# Patient Record
Sex: Male | Born: 1970 | Race: White | Hispanic: No | Marital: Married | Smoking: Never smoker
Health system: Southern US, Community
[De-identification: ages and names within clinical notes are randomized; demographics above are authoritative.]

## PROBLEM LIST (undated history)

## (undated) DIAGNOSIS — J019 Acute sinusitis, unspecified: Secondary | ICD-10-CM

## (undated) DIAGNOSIS — T7840XA Allergy, unspecified, initial encounter: Secondary | ICD-10-CM

## (undated) HISTORY — DX: Acute sinusitis, unspecified: J01.90

## (undated) HISTORY — DX: Allergy, unspecified, initial encounter: T78.40XA

---

## 1995-06-24 HISTORY — PX: KNEE ARTHROSCOPY W/ MENISCAL REPAIR: SHX1877

## 2006-10-09 ENCOUNTER — Encounter: Payer: Self-pay | Admitting: Family Medicine

## 2006-12-02 ENCOUNTER — Encounter (INDEPENDENT_AMBULATORY_CARE_PROVIDER_SITE_OTHER): Payer: Self-pay | Admitting: *Deleted

## 2007-01-19 ENCOUNTER — Ambulatory Visit (HOSPITAL_COMMUNITY): Admission: RE | Admit: 2007-01-19 | Discharge: 2007-01-19 | Payer: Self-pay | Admitting: Family Medicine

## 2007-01-19 ENCOUNTER — Ambulatory Visit: Payer: Self-pay | Admitting: Family Medicine

## 2007-02-01 ENCOUNTER — Encounter: Payer: Self-pay | Admitting: Family Medicine

## 2007-11-29 ENCOUNTER — Encounter: Payer: Self-pay | Admitting: Family Medicine

## 2008-11-30 ENCOUNTER — Ambulatory Visit: Payer: Self-pay | Admitting: Family Medicine

## 2008-12-01 LAB — CONVERTED CEMR LAB
ALT: 21 units/L (ref 0–53)
AST: 27 units/L (ref 0–37)
BUN: 16 mg/dL (ref 6–23)
Bilirubin, Direct: 0 mg/dL (ref 0.0–0.3)
Calcium: 9.4 mg/dL (ref 8.4–10.5)
Cholesterol: 176 mg/dL (ref 0–200)
Creatinine, Ser: 0.9 mg/dL (ref 0.4–1.5)
Eosinophils Relative: 2.1 % (ref 0.0–5.0)
GFR calc non Af Amer: 99.34 mL/min (ref >60)
LDL Cholesterol: 108 mg/dL — ABNORMAL HIGH (ref 0–99)
Monocytes Absolute: 0.6 10*3/uL (ref 0.1–1.0)
Monocytes Relative: 12.1 % — ABNORMAL HIGH (ref 3.0–12.0)
Neutrophils Relative %: 55.8 % (ref 43.0–77.0)
Platelets: 183 10*3/uL (ref 150.0–400.0)
Total Bilirubin: 0.9 mg/dL (ref 0.3–1.2)
Triglycerides: 67 mg/dL (ref 0.0–149.0)
VLDL: 13.4 mg/dL (ref 0.0–40.0)
WBC: 5 10*3/uL (ref 4.5–10.5)

## 2009-01-18 ENCOUNTER — Telehealth: Payer: Self-pay | Admitting: *Deleted

## 2009-04-20 ENCOUNTER — Ambulatory Visit: Payer: Self-pay | Admitting: Family Medicine

## 2009-04-20 DIAGNOSIS — J019 Acute sinusitis, unspecified: Secondary | ICD-10-CM | POA: Insufficient documentation

## 2009-04-20 HISTORY — DX: Acute sinusitis, unspecified: J01.90

## 2010-01-15 ENCOUNTER — Ambulatory Visit: Payer: Self-pay | Admitting: Family Medicine

## 2010-01-15 LAB — CONVERTED CEMR LAB
ALT: 24 U/L (ref 0–53)
AST: 22 U/L (ref 0–37)
Albumin: 4.1 g/dL (ref 3.5–5.2)
Alkaline Phosphatase: 45 U/L (ref 39–117)
BUN: 15 mg/dL (ref 6–23)
Basophils Absolute: 0 K/uL (ref 0.0–0.1)
Basophils Relative: 0.4 % (ref 0.0–3.0)
Bilirubin Urine: NEGATIVE
Bilirubin, Direct: 0.1 mg/dL (ref 0.0–0.3)
Blood in Urine, dipstick: NEGATIVE
CO2: 31 meq/L (ref 19–32)
Calcium: 9.1 mg/dL (ref 8.4–10.5)
Chloride: 104 meq/L (ref 96–112)
Cholesterol: 224 mg/dL — ABNORMAL HIGH (ref 0–200)
Creatinine, Ser: 0.9 mg/dL (ref 0.4–1.5)
Direct LDL: 146.8 mg/dL
Eosinophils Absolute: 0.2 K/uL (ref 0.0–0.7)
Eosinophils Relative: 3.9 % (ref 0.0–5.0)
GFR calc non Af Amer: 95.11 mL/min (ref >60)
Glucose, Bld: 98 mg/dL (ref 70–99)
Glucose, Urine, Semiquant: NEGATIVE
HCT: 44 % (ref 39.0–52.0)
HDL: 58 mg/dL (ref >39.00)
Hemoglobin: 15.3 g/dL (ref 13.0–17.0)
Ketones, urine, test strip: NEGATIVE
Lymphocytes Relative: 25.5 % (ref 12.0–46.0)
Lymphs Abs: 1.5 K/uL (ref 0.7–4.0)
MCHC: 34.7 g/dL (ref 30.0–36.0)
MCV: 93.4 fL (ref 78.0–100.0)
Monocytes Absolute: 0.7 K/uL (ref 0.1–1.0)
Monocytes Relative: 11.6 % (ref 3.0–12.0)
Neutro Abs: 3.4 K/uL (ref 1.4–7.7)
Neutrophils Relative %: 58.6 % (ref 43.0–77.0)
Nitrite: NEGATIVE
Platelets: 180 K/uL (ref 150.0–400.0)
Potassium: 3.8 meq/L (ref 3.5–5.1)
Protein, U semiquant: NEGATIVE
RBC: 4.72 M/uL (ref 4.22–5.81)
RDW: 13.4 % (ref 11.5–14.6)
Sodium: 139 meq/L (ref 135–145)
Specific Gravity, Urine: 1.02
TSH: 1.21 u[IU]/mL (ref 0.35–5.50)
Total Bilirubin: 0.8 mg/dL (ref 0.3–1.2)
Total CHOL/HDL Ratio: 4
Total Protein: 6.6 g/dL (ref 6.0–8.3)
Triglycerides: 124 mg/dL (ref 0.0–149.0)
Urobilinogen, UA: 0.2
VLDL: 24.8 mg/dL (ref 0.0–40.0)
WBC Urine, dipstick: NEGATIVE
WBC: 5.8 10*3/microliter (ref 4.5–10.5)
pH: 7

## 2010-01-25 ENCOUNTER — Ambulatory Visit: Payer: Self-pay | Admitting: Family Medicine

## 2010-07-12 ENCOUNTER — Ambulatory Visit
Admission: RE | Admit: 2010-07-12 | Discharge: 2010-07-12 | Payer: Self-pay | Source: Home / Self Care | Attending: Family Medicine | Admitting: Family Medicine

## 2010-07-25 NOTE — Assessment & Plan Note (Signed)
Summary: SINUSITIS, ST // RS   Vital Signs:  Patient profile:   40 year old male Temp:     97.8 degrees F oral BP sitting:   128 / 78  (left arm) Cuff size:   regular  Vitals Entered By: Sid Falcon LPN (July 12, 2010 2:53 PM)  History of Present Illness: Onset illness 3-4 days ago. sinus congestion, sore throat. Other family members sick.  Some body aches. Nausea without vomiting.  Some mild cough.  Freq sneezing. Took Nyquil with some relief.  No fever.  Some mild yellowish discharge.  Allergies: 1)  ! Percocet (Oxycodone-Acetaminophen)  Past History:  Past Medical History: Last updated: 04/20/2009 Chicken pox Hay fever/Allergies  Physical Exam  General:  Well-developed,well-nourished,in no acute distress; alert,appropriate and cooperative throughout examination Ears:  External ear exam shows no significant lesions or deformities.  Otoscopic examination reveals clear canals, tympanic membranes are intact bilaterally without bulging, retraction, inflammation or discharge. Hearing is grossly normal bilaterally. Nose:  External nasal examination shows no deformity or inflammation. Nasal mucosa are pink and moist without lesions or exudates. Mouth:  Oral mucosa and oropharynx without lesions or exudates.  Teeth in good repair. Neck:  No deformities, masses, or tenderness noted. Lungs:  Normal respiratory effort, chest expands symmetrically. Lungs are clear to auscultation, no crackles or wheezes. Heart:  normal rate and regular rhythm.     Impression & Recommendations:  Problem # 1:  ACUTE SINUSITIS, UNSPECIFIED (ICD-461.9) suspect viral at this time but wrote Ab script if symptoms worsen or persist as hx freq sinusitis in past. His updated medication list for this problem includes:    Amoxicillin 875 Mg Tabs (Amoxicillin) ..... One by mouth two times a day for 10 days  Complete Medication List: 1)  One-a-day Mens Tabs (Multiple vitamin) .... Once daily 2)  Vit C   .... Once daily 3)  Amoxicillin 875 Mg Tabs (Amoxicillin) .... One by mouth two times a day for 10 days  Patient Instructions: 1)  Acute sinusitis symptoms for less than 10 days are not helped by antibiotics. Use warm moist compresses, and over the counter decongestants( only as directed). Call if no improvement in 5-7 days, sooner if increasing pain, fever, or new symptoms.  Prescriptions: AMOXICILLIN 875 MG TABS (AMOXICILLIN) one by mouth two times a day for 10 days  #20 x 0   Entered and Authorized by:   Evelena Peat MD   Signed by:   Evelena Peat MD on 07/12/2010   Method used:   Print then Give to Patient   RxID:   [id number]    Orders Added: 1)  Est. Patient Level III [76160]

## 2010-07-25 NOTE — Assessment & Plan Note (Signed)
Summary: cpx/njr   Vital Signs:  Patient profile:   40 year old male Height:      69.25 inches Weight:      157 pounds BMI:     23.10 Temp:     97.9 degrees F oral Pulse rate:   44 / minute Pulse rhythm:   regular Resp:     12 per minute BP sitting:   120 / 82  (left arm) Cuff size:   regular  Vitals Entered By: Sid Falcon LPN (January 25, 2010 3:05 PM) CC: CPX   CC:  CPX.  History of Present Illness: Here for CPE.  No chronic medical problems.  Exercises regularly. Hx of bradycardia related to exercise. No medications.  FH and SH reviewed and no signif changes.  Clinical Review Panels:  Immunizations   Last Tetanus Booster:  Historical (09/22/2006)  Lipid Management   Cholesterol:  224 (01/15/2010)   LDL (bad choesterol):  108 (11/30/2008)   HDL (good cholesterol):  58.00 (01/15/2010)  Diabetes Management   Creatinine:  0.9 (01/15/2010)  CBC   WBC:  5.8 (01/15/2010)   RBC:  4.72 (01/15/2010)   Hgb:  15.3 (01/15/2010)   Hct:  44.0 (01/15/2010)   Platelets:  180.0 (01/15/2010)   MCV  93.4 (01/15/2010)   MCHC  34.7 (01/15/2010)   RDW  13.4 (01/15/2010)   PMN:  58.6 (01/15/2010)   Lymphs:  25.5 (01/15/2010)   Monos:  11.6 (01/15/2010)   Eosinophils:  3.9 (01/15/2010)   Basophil:  0.4 (01/15/2010)  Complete Metabolic Panel   Glucose:  98 (01/15/2010)   Sodium:  139 (01/15/2010)   Potassium:  3.8 (01/15/2010)   Chloride:  104 (01/15/2010)   CO2:  31 (01/15/2010)   BUN:  15 (01/15/2010)   Creatinine:  0.9 (01/15/2010)   Albumin:  4.1 (01/15/2010)   Total Protein:  6.6 (01/15/2010)   Calcium:  9.1 (01/15/2010)   Total Bili:  0.8 (01/15/2010)   Alk Phos:  45 (01/15/2010)   SGPT (ALT):  24 (01/15/2010)   SGOT (AST):  22 (01/15/2010)   Allergies: 1)  ! Percocet (Oxycodone-Acetaminophen)  Past History:  Past Medical History: Last updated: 04/20/2009 Chicken pox Hay fever/Allergies  Past Surgical History: Last updated: 11/30/2008 Knee  ligament meniscus tear 1997  Family History: Last updated: 11/30/2008 Family history prostate cancer Family history heart disease, parent (father late 54s), grandparent Family History Hypertension, parent, grandparent  Social History: Last updated: 11/30/2008 Occupation:  Runner, broadcasting/film/video Married Never Smoked Alcohol use-no  Risk Factors: Smoking Status: never (11/30/2008) PMH-FH-SH reviewed for relevance  Review of Systems  The patient denies anorexia, fever, weight loss, weight gain, vision loss, decreased hearing, hoarseness, chest pain, syncope, dyspnea on exertion, peripheral edema, prolonged cough, headaches, hemoptysis, abdominal pain, melena, hematochezia, severe indigestion/heartburn, hematuria, incontinence, genital sores, muscle weakness, suspicious skin lesions, transient blindness, difficulty walking, depression, unusual weight change, abnormal bleeding, enlarged lymph nodes, and testicular masses.    Physical Exam  General:  Well-developed,well-nourished,in no acute distress; alert,appropriate and cooperative throughout examination Head:  Normocephalic and atraumatic without obvious abnormalities. No apparent alopecia or balding. Eyes:  No corneal or conjunctival inflammation noted. EOMI. Perrla. Funduscopic exam benign, without hemorrhages, exudates or papilledema. Vision grossly normal. Ears:  External ear exam shows no significant lesions or deformities.  Otoscopic examination reveals clear canals, tympanic membranes are intact bilaterally without bulging, retraction, inflammation or discharge. Hearing is grossly normal bilaterally. Mouth:  Oral mucosa and oropharynx without lesions or exudates.  Teeth in good repair.  Neck:  No deformities, masses, or tenderness noted. Lungs:  Normal respiratory effort, chest expands symmetrically. Lungs are clear to auscultation, no crackles or wheezes. Heart:  patient has bradycardia which is normal for him as he does long-distance running  but normal rhythm no gallop.   Abdomen:  Bowel sounds positive,abdomen soft and non-tender without masses, organomegaly or hernias noted. Genitalia:  Testes bilaterally descended without nodularity, tenderness or masses. No scrotal masses or lesions. No penis lesions or urethral discharge. Msk:  No deformity or scoliosis noted of thoracic or lumbar spine.   Extremities:  No clubbing, cyanosis, edema, or deformity noted with normal full range of motion of all joints.   Neurologic:  No cranial nerve deficits noted. Station and gait are normal. Plantar reflexes are down-going bilaterally. DTRs are symmetrical throughout. Sensory, motor and coordinative functions appear intact. Skin:  Intact without suspicious lesions or rashes Cervical Nodes:  No lymphadenopathy noted Psych:  Cognition and judgment appear intact. Alert and cooperative with normal attention span and concentration. No apparent delusions, illusions, hallucinations   Impression & Recommendations:  Problem # 1:  Preventive Health Care (ICD-V70.0) labs reviewed with patient. Minimally elevated cholesterol otherwise normal. Discussed diet relevant to lipids.  He is not interested in more aggressive therapy for lipids at this time in terms of statins.  Complete Medication List: 1)  One-a-day Mens Tabs (Multiple vitamin) .... Once daily 2)  Vit C  .... Once daily

## 2011-02-05 ENCOUNTER — Other Ambulatory Visit (INDEPENDENT_AMBULATORY_CARE_PROVIDER_SITE_OTHER): Payer: BC Managed Care – PPO

## 2011-02-05 DIAGNOSIS — Z0000 Encounter for general adult medical examination without abnormal findings: Secondary | ICD-10-CM

## 2011-02-05 LAB — TSH: TSH: 1.59 u[IU]/mL (ref 0.35–5.50)

## 2011-02-05 LAB — POCT URINALYSIS DIPSTICK
Bilirubin, UA: NEGATIVE
Blood, UA: NEGATIVE
Glucose, UA: NEGATIVE
Ketones, UA: NEGATIVE
Nitrite, UA: NEGATIVE
Spec Grav, UA: 1.015
pH, UA: 7

## 2011-02-05 LAB — CBC WITH DIFFERENTIAL/PLATELET
Basophils Relative: 0.2 % (ref 0.0–3.0)
Eosinophils Absolute: 0.1 10*3/uL (ref 0.0–0.7)
Eosinophils Relative: 2.4 % (ref 0.0–5.0)
HCT: 46.4 % (ref 39.0–52.0)
Lymphs Abs: 1.3 10*3/uL (ref 0.7–4.0)
MCHC: 34.1 g/dL (ref 30.0–36.0)
MCV: 93.3 fl (ref 78.0–100.0)
Monocytes Absolute: 0.6 10*3/uL (ref 0.1–1.0)
Neutrophils Relative %: 63.8 % (ref 43.0–77.0)
Platelets: 200 10*3/uL (ref 150.0–400.0)
RBC: 4.97 Mil/uL (ref 4.22–5.81)

## 2011-02-05 LAB — HEPATIC FUNCTION PANEL
ALT: 24 U/L (ref 0–53)
AST: 27 U/L (ref 0–37)
Alkaline Phosphatase: 50 U/L (ref 39–117)
Total Bilirubin: 0.5 mg/dL (ref 0.3–1.2)

## 2011-02-05 LAB — BASIC METABOLIC PANEL
BUN: 17 mg/dL (ref 6–23)
CO2: 28 mEq/L (ref 19–32)
Chloride: 107 mEq/L (ref 96–112)
Creatinine, Ser: 1.1 mg/dL (ref 0.4–1.5)
Potassium: 3.8 mEq/L (ref 3.5–5.1)

## 2011-02-05 LAB — LIPID PANEL
Total CHOL/HDL Ratio: 4
Triglycerides: 78 mg/dL (ref 0.0–149.0)

## 2011-02-12 ENCOUNTER — Encounter: Payer: Self-pay | Admitting: Family Medicine

## 2011-02-13 ENCOUNTER — Encounter: Payer: Self-pay | Admitting: Family Medicine

## 2011-02-13 ENCOUNTER — Ambulatory Visit (INDEPENDENT_AMBULATORY_CARE_PROVIDER_SITE_OTHER): Payer: BC Managed Care – PPO | Admitting: Family Medicine

## 2011-02-13 VITALS — BP 112/80 | HR 64 | Temp 98.0°F | Resp 16 | Ht 69.0 in | Wt 158.0 lb

## 2011-02-13 DIAGNOSIS — Z0000 Encounter for general adult medical examination without abnormal findings: Secondary | ICD-10-CM

## 2011-02-13 NOTE — Progress Notes (Signed)
  Subjective:    Patient ID: DEMPSY Barrera, male    DOB: 1970/09/11, 40 y.o.   MRN: [id number]  HPI Patient here for complete physical. No chronic medical problems. Takes no prescription medications. Previous intolerance to oxycodone. Takes multivitamins. Tetanus 2008. Past medical history, social history, and family history reviewed.  Father had CAD in his 66s  Patient continues to exercise regularly. Runs about 20-30 miles per week.  Past Medical History  Diagnosis Date  . Allergy   . Acute sinusitis, unspecified 04/20/2009   Past Surgical History  Procedure Date  . Knee arthroscopy w/ meniscal repair 1997    reports that he has never smoked. He has never used smokeless tobacco. He reports that he does not drink alcohol. His drug history not on file. family history includes Cancer in an unspecified family member; Heart disease (age of onset:46) in his father; and Hypertension in an unspecified family member. Allergies  Allergen Reactions  . Oxycodone-Acetaminophen     REACTION: rash      Review of Systems  Constitutional: Negative for fever, activity change, appetite change, fatigue and unexpected weight change.  HENT: Negative for ear pain, congestion and trouble swallowing.   Eyes: Negative for pain and visual disturbance.  Respiratory: Negative for cough, shortness of breath and wheezing.   Cardiovascular: Negative for chest pain and palpitations.  Gastrointestinal: Negative for nausea, vomiting, abdominal pain, diarrhea, constipation, blood in stool, abdominal distention and rectal pain.  Genitourinary: Negative for dysuria, hematuria and testicular pain.  Musculoskeletal: Negative for joint swelling and arthralgias.  Skin: Negative for rash.  Neurological: Negative for dizziness, syncope and headaches.  Hematological: Negative for adenopathy.  Psychiatric/Behavioral: Negative for confusion and dysphoric mood.       Objective:   Physical Exam  Constitutional: He is  oriented to person, place, and time. He appears well-developed and well-nourished. No distress.  HENT:  Head: Normocephalic and atraumatic.  Right Ear: External ear normal.  Left Ear: External ear normal.  Mouth/Throat: Oropharynx is clear and moist.  Eyes: Conjunctivae and EOM are normal. Pupils are equal, round, and reactive to light.  Neck: Normal range of motion. Neck supple. No thyromegaly present.  Cardiovascular: Normal rate, regular rhythm and normal heart sounds.   No murmur heard. Pulmonary/Chest: No respiratory distress. He has no wheezes. He has no rales.  Abdominal: Soft. Bowel sounds are normal. He exhibits no distension and no mass. There is no tenderness. There is no rebound and no guarding.  Musculoskeletal: He exhibits no edema.  Lymphadenopathy:    He has no cervical adenopathy.  Neurological: He is alert and oriented to person, place, and time. He displays normal reflexes. No cranial nerve deficit.  Skin: No rash noted.       No concerning skin lesions  Psychiatric: He has a normal mood and affect. His behavior is normal.          Assessment & Plan:  Healthy 40 year old male. Labs reviewed with patient. Minimally elevated lipids otherwise normal. Tetanus up to date. Continue regular exercise. Recommendation for flu vaccine this fall.

## 2011-05-27 ENCOUNTER — Ambulatory Visit: Payer: BC Managed Care – PPO | Admitting: Internal Medicine

## 2011-06-03 ENCOUNTER — Ambulatory Visit (INDEPENDENT_AMBULATORY_CARE_PROVIDER_SITE_OTHER): Payer: BC Managed Care – PPO | Admitting: Family Medicine

## 2011-06-03 ENCOUNTER — Encounter: Payer: Self-pay | Admitting: Family Medicine

## 2011-06-03 VITALS — BP 120/80 | Temp 98.4°F | Wt 154.0 lb

## 2011-06-03 DIAGNOSIS — J45909 Unspecified asthma, uncomplicated: Secondary | ICD-10-CM

## 2011-06-03 MED ORDER — PREDNISONE 20 MG PO TABS: ORAL_TABLET | ORAL | Status: DC

## 2011-06-03 MED ORDER — HYDROCODONE-HOMATROPINE 5-1.5 MG/5ML PO SYRP: ORAL_SOLUTION | ORAL | Status: DC

## 2011-06-03 NOTE — Progress Notes (Signed)
  Subjective:    Patient ID: Warren Barrera, male    DOB: 1970/07/10, 40 y.o.   MRN: [id number]  HPI Warren Barrera s a 40 year old, married male, nonsmoker .....Marland Kitchen[place].........Marland Kitchen Went to [employer]........... English major......... 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  He developed a cough two months ago, and it hasn't gone away.  He said the fever, earache, sore throat, nausea, vomiting, diarrhea.  He does not recall any history of asthma.  He does have a history of allergic rhinitis.  Environmental review of systems at work and home negative.   Review of Systems    General and immunologic review of systems in pulmonary of systems otherwise negative Objective:   Physical Exam Well-developed well-nourished man no acute distress.  HEENT negative.  Neck was supple.  No adenopathy.  Lungs were clear       Assessment & Plan:  Reactive airway disease.  Plan prednisone burst and taper return p.r.n.

## 2011-06-03 NOTE — Patient Instructions (Signed)
Take the prednisone as directed........Marland Kitchen Two tabs x 3 days............. Or untily get a whole lot better........ Then taper as directed.  Hydromet one half to 1 teaspoon at bedtime p.r.n. Cough.  Return p.r.n.

## 2011-06-04 ENCOUNTER — Ambulatory Visit: Payer: BC Managed Care – PPO | Admitting: Family Medicine

## 2011-06-10 ENCOUNTER — Telehealth: Payer: Self-pay

## 2011-06-10 NOTE — Telephone Encounter (Signed)
[facility] Triage Call Report Triage Record Num: [id number] Operator: Durward Mallard [facility] Patient Name: Warren Barrera Call Date & Time: 06/07/2011 8:57:30AM Patient Phone: [phone] PCP: Evelena Peat Patient Gender: Male PCP Fax : [phone] Patient DOB: Oct 24, 1970 [facility] Name: [facility] Reason for Call: Caller: Tom/Patient; PCP: Caryl Never (Burr-shet), Elberta Fortis.; CB#: [phone]; Call regarding Cough/Congestion; sx started 2 months ago for the cough; was seen in the office on 06/03/11 and was dx with RAD and Prednisone was started; Prednisone is not improving the sx; pt states that he has severe yellow discharge with a h/a; he feels that he has a sinus infection; pt is unsure if he has a fever; can not take temp at this time; triaged per URI Guideline; See in 24 hr d/t "Moderate h/a for more than 24 hr unrelieved with nonprescription medications;" will go to UC and not sure which he will go to at this time; [facility] recommended Protocol(s) Used: Upper Respiratory Infection (URI) Recommended Outcome per Protocol: See Provider within 24 hours Reason for Outcome: Mild to moderate headache for more than 24 hours unrelieved with nonprescription medications Care Advice: ~ Use a cool mist humidifier to moisten air. Be sure to clean according to manufacturer's instructions. Call provider if headache worsens, develops temperature greater than 101.20F (38.1C) or any temperature elevation in a geriatric or immunocompromised patient (such as diabetes, HIV/AIDS, chemotherapy, organ transplant, or chronic steroid use). ~ ~ SYMPTOM / CONDITION MANAGEMENT A warm, moist compress placed on face, over eyes for 15 to 20 minutes, 5 to 6 times a day, may help relieve the congestion. ~ Analgesic/Antipyretic Advice - Acetaminophen: Consider acetaminophen as directed on label or by pharmacist/provider for pain or fever. PRECAUTIONS: - Use only if there is no history of liver disease,  alcoholism, or intake of three or more alcohol drinks per day. - If approved by provider when breastfeeding. - Do not exceed recommended dose or frequency. ~ 12/

## 2011-06-11 ENCOUNTER — Encounter: Payer: Self-pay | Admitting: Family Medicine

## 2011-06-11 ENCOUNTER — Ambulatory Visit (INDEPENDENT_AMBULATORY_CARE_PROVIDER_SITE_OTHER): Payer: BC Managed Care – PPO | Admitting: Family Medicine

## 2011-06-11 VITALS — BP 120/84 | Temp 98.3°F | Wt 157.0 lb

## 2011-06-11 DIAGNOSIS — J019 Acute sinusitis, unspecified: Secondary | ICD-10-CM

## 2011-06-11 DIAGNOSIS — R05 Cough: Secondary | ICD-10-CM

## 2011-06-11 MED ORDER — AMOXICILLIN-POT CLAVULANATE 875-125 MG PO TABS: 1.0000 | ORAL_TABLET | Freq: Two times a day (BID) | ORAL | Status: AC

## 2011-06-11 NOTE — Patient Instructions (Signed)
Call in 2 weeks if cough not better and sooner as needed.

## 2011-06-11 NOTE — Progress Notes (Signed)
  Subjective:    Patient ID: Warren Barrera, male    DOB: [birth year], 40 y.o.   MRN: [id number]  HPI  Cough off and on for almost 2 months. Continues to run. No history of smoking. No history of reactive airway disease. Recently placed on prednisone with no relief. Is not aware of any wheezing. No active GERD symptoms. No history of asthma. No postnasal drip symptoms. He does have intermittent facial pain greenish nasal discharge for several weeks. Intermittent headaches. No fever. Intermittent sore throat. He is concerned about sinusitis issues. No recent travels. No dyspnea.   Review of Systems  Constitutional: Negative for fever, chills and fatigue.  Respiratory: Positive for cough. Negative for shortness of breath, wheezing and stridor.   Cardiovascular: Negative for chest pain.       Objective:   Physical Exam  Constitutional: He appears well-developed and well-nourished. No distress.  HENT:  Right Ear: External ear normal.  Left Ear: External ear normal.  Mouth/Throat: Oropharynx is clear and moist.  Neck: Neck supple.  Cardiovascular: Normal rate and regular rhythm.   Pulmonary/Chest: Effort normal and breath sounds normal. No respiratory distress. He has no wheezes. He has no rales.  Lymphadenopathy:    He has no cervical adenopathy.          Assessment & Plan:  Chronic cough. Suspect acute versus chronic sinusitis. Augmentin 875 mg twice daily for 10 days. Chest x-ray if symptoms not resolving in the next 2 weeks

## 2011-06-12 ENCOUNTER — Ambulatory Visit: Payer: BC Managed Care – PPO | Admitting: Family Medicine

## 2011-06-26 ENCOUNTER — Ambulatory Visit (INDEPENDENT_AMBULATORY_CARE_PROVIDER_SITE_OTHER): Payer: BC Managed Care – PPO | Admitting: Family Medicine

## 2011-06-26 ENCOUNTER — Ambulatory Visit: Payer: BC Managed Care – PPO | Admitting: Family Medicine

## 2011-06-26 ENCOUNTER — Encounter: Payer: Self-pay | Admitting: Family Medicine

## 2011-06-26 VITALS — BP 110/78 | Temp 98.0°F | Wt 155.0 lb

## 2011-06-26 DIAGNOSIS — R059 Cough, unspecified: Secondary | ICD-10-CM

## 2011-06-26 DIAGNOSIS — R05 Cough: Secondary | ICD-10-CM

## 2011-06-26 MED ORDER — OMEPRAZOLE 20 MG PO CPDR: 20.0000 mg | DELAYED_RELEASE_CAPSULE | Freq: Every day | ORAL | Status: DC

## 2011-06-26 NOTE — Progress Notes (Signed)
  Subjective:    Patient ID: Warren Barrera, male    DOB: 1971/04/22, 41 y.o.   MRN: [id number]  HPI  Persistent cough off and on since October. Patient was seen last visit with probable acute sinusitis symptoms. Treat with Augmentin and sinus symptoms have improved. His cough remains mostly nonproductive and is a least 50% improved though not resolved. He has no history of asthma or reactive airway disease. No history of smoking. Continues to run for exercise. No dyspnea. Denies post nasal drip symptoms. Not aware of any wheezing. Does clear throat frequently.  No active reflux but does have occasional hoarseness. Patient denies any appetite changes, weight changes, dyspnea, or chest pain.  Never filled cough suppressant. Was previously on one prednisone burst but this did not help his cough.   Review of Systems  Constitutional: Negative for fever, chills, appetite change and unexpected weight change.  HENT: Negative for sore throat, voice change, postnasal drip and sinus pressure.   Respiratory: Positive for cough. Negative for shortness of breath and wheezing.   Cardiovascular: Negative for chest pain, palpitations and leg swelling.  Neurological: Negative for syncope and headaches.  Hematological: Negative for adenopathy.       Objective:   Physical Exam  Constitutional: He appears well-developed and well-nourished.  HENT:  Mouth/Throat: Oropharynx is clear and moist.  Neck: Neck supple.  Cardiovascular: Normal rate and regular rhythm.   Pulmonary/Chest: Effort normal and breath sounds normal. No respiratory distress. He has no wheezes. He has no rales.  Lymphadenopathy:    He has no cervical adenopathy.          Assessment & Plan:  Chronic cough. Normal exam. Question GERD related. He does not have evidence for reactive airway disease. No post nasal drip symptoms at this point. Recent sinusitis symptoms have improved. Scale back caffeine use. No peppermint or spearmint  products. Educational sheet given on GERD prevention. Elevate head of bed 6-10 inches. Omeprazole 20 mg daily. Obtain chest x-ray. Consider pulmonary referral 2-3 weeks if no improvement

## 2011-06-26 NOTE — Patient Instructions (Signed)
Diet for GERD or PUD Nutrition therapy can help ease the discomfort of gastroesophageal reflux disease (GERD) and peptic ulcer disease (PUD).  HOME CARE INSTRUCTIONS   Eat your meals slowly, in a relaxed setting.   Eat 5 to 6 small meals per day.   If a food causes distress, stop eating it for a period of time.  FOODS TO AVOID  Coffee, regular or decaffeinated.   Cola beverages, regular or low calorie.   Tea, regular or decaffeinated.   Pepper.   Cocoa.   High fat foods, including meats.   Butter, margarine, hydrogenated oil (trans fats).   Peppermint or spearmint (if you have GERD).   Fruits and vegetables if not tolerated.   Alcohol.   Nicotine (smoking or chewing). This is one of the most potent stimulants to acid production in the gastrointestinal tract.   Any food that seems to aggravate your condition.  If you have questions regarding your diet, ask your caregiver or a registered dietitian. TIPS  Lying flat may make symptoms worse. Keep the head of your bed raised 6 to 9 inches (15 to 23 cm) by using a foam wedge or blocks under the legs of the bed.   Do not lay down until 3 hours after eating a meal.   Daily physical activity may help reduce symptoms.  MAKE SURE YOU:   Understand these instructions.   Will watch your condition.   Will get help right away if you are not doing well or get worse.  Document Released: 06/09/2005 Document Revised: 02/19/2011 Document Reviewed: 10/23/2008 [facility] Patient Information 2012 [place], [place].  Consider elevation of head of bed 6-10 inches Gradually wean caffeine use. Call in 2 weeks if cough not resolving.

## 2011-06-27 ENCOUNTER — Ambulatory Visit (INDEPENDENT_AMBULATORY_CARE_PROVIDER_SITE_OTHER)
Admission: RE | Admit: 2011-06-27 | Discharge: 2011-06-27 | Disposition: A | Discharge status: Home / Self Care | Payer: BC Managed Care – PPO | Source: Ambulatory Visit | Attending: Family Medicine | Admitting: Family Medicine

## 2011-06-27 ENCOUNTER — Ambulatory Visit: Payer: BC Managed Care – PPO | Admitting: Family Medicine

## 2011-06-27 DIAGNOSIS — R05 Cough: Secondary | ICD-10-CM

## 2011-07-01 NOTE — Progress Notes (Signed)
Quick Note:  Pt informed on personally identified VM ______ 

## 2011-10-31 ENCOUNTER — Other Ambulatory Visit: Payer: Self-pay | Admitting: Family Medicine

## 2011-12-11 ENCOUNTER — Telehealth: Payer: Self-pay | Admitting: Family Medicine

## 2011-12-11 NOTE — Telephone Encounter (Signed)
Pt would like to have a particle test done with his cpx labs. Please advise

## 2011-12-11 NOTE — Telephone Encounter (Signed)
Pt is going to talk to his father and find out what test he had, he will call us back.

## 2011-12-11 NOTE — Telephone Encounter (Signed)
LMTCB

## 2011-12-11 NOTE — Telephone Encounter (Signed)
Make sure he is referring to NMR or Lipoprotein profile.  If so, may order.  May have to check with Gwen to order.

## 2011-12-11 NOTE — Telephone Encounter (Signed)
Pt called back with additional information about the lab he is requesting.  Pt has family history of CAD, father has been treated just recently.  Pt is asking for the more "in depth particle test for his LDL, cholesterol".  Pt labs are scheduled for mid July.

## 2011-12-26 ENCOUNTER — Other Ambulatory Visit: Payer: Self-pay | Admitting: *Deleted

## 2011-12-26 DIAGNOSIS — Z8249 Family history of ischemic heart disease and other diseases of the circulatory system: Secondary | ICD-10-CM

## 2012-01-06 ENCOUNTER — Other Ambulatory Visit (INDEPENDENT_AMBULATORY_CARE_PROVIDER_SITE_OTHER): Payer: BC Managed Care – PPO

## 2012-01-06 DIAGNOSIS — Z8249 Family history of ischemic heart disease and other diseases of the circulatory system: Secondary | ICD-10-CM

## 2012-01-06 DIAGNOSIS — Z0000 Encounter for general adult medical examination without abnormal findings: Secondary | ICD-10-CM

## 2012-01-06 LAB — BASIC METABOLIC PANEL WITH GFR
BUN: 16 mg/dL (ref 6–23)
CO2: 28 meq/L (ref 19–32)
Calcium: 9.5 mg/dL (ref 8.4–10.5)
Chloride: 104 meq/L (ref 96–112)
Creatinine, Ser: 1.1 mg/dL (ref 0.4–1.5)
GFR: 81.9 mL/min
Glucose, Bld: 102 mg/dL — ABNORMAL HIGH (ref 70–99)
Potassium: 4.2 meq/L (ref 3.5–5.1)
Sodium: 141 meq/L (ref 135–145)

## 2012-01-06 LAB — CBC WITH DIFFERENTIAL/PLATELET
Basophils Absolute: 0 K/uL (ref 0.0–0.1)
Basophils Relative: 0.4 % (ref 0.0–3.0)
Eosinophils Absolute: 0.1 K/uL (ref 0.0–0.7)
Eosinophils Relative: 1.7 % (ref 0.0–5.0)
HCT: 45.7 % (ref 39.0–52.0)
Hemoglobin: 15.3 g/dL (ref 13.0–17.0)
Lymphocytes Relative: 15.5 % (ref 12.0–46.0)
Lymphs Abs: 1.2 K/uL (ref 0.7–4.0)
MCHC: 33.4 g/dL (ref 30.0–36.0)
MCV: 93.2 fl (ref 78.0–100.0)
Monocytes Absolute: 0.8 K/uL (ref 0.1–1.0)
Monocytes Relative: 10.2 % (ref 3.0–12.0)
Neutro Abs: 5.5 K/uL (ref 1.4–7.7)
Neutrophils Relative %: 72.2 % (ref 43.0–77.0)
Platelets: 193 K/uL (ref 150.0–400.0)
RBC: 4.9 Mil/uL (ref 4.22–5.81)
RDW: 13.1 % (ref 11.5–14.6)
WBC: 7.6 K/uL (ref 4.5–10.5)

## 2012-01-06 LAB — POCT URINALYSIS DIPSTICK
Bilirubin, UA: NEGATIVE
Blood, UA: NEGATIVE
Glucose, UA: NEGATIVE
Ketones, UA: NEGATIVE
Leukocytes, UA: NEGATIVE
Nitrite, UA: NEGATIVE
Protein, UA: NEGATIVE
Spec Grav, UA: 1.01
Urobilinogen, UA: 0.2
pH, UA: 7

## 2012-01-06 LAB — HEPATIC FUNCTION PANEL
ALT: 22 U/L (ref 0–53)
Albumin: 4.3 g/dL (ref 3.5–5.2)
Bilirubin, Direct: 0 mg/dL (ref 0.0–0.3)
Total Protein: 7.1 g/dL (ref 6.0–8.3)

## 2012-01-06 LAB — LIPID PANEL
Total CHOL/HDL Ratio: 4
Triglycerides: 81 mg/dL (ref 0.0–149.0)

## 2012-01-07 LAB — NMR, LIPOPROFILE

## 2012-01-12 ENCOUNTER — Encounter: Payer: Self-pay | Admitting: Family Medicine

## 2012-01-12 ENCOUNTER — Ambulatory Visit (INDEPENDENT_AMBULATORY_CARE_PROVIDER_SITE_OTHER): Payer: BC Managed Care – PPO | Admitting: Family Medicine

## 2012-01-12 VITALS — BP 120/72 | HR 40 | Temp 98.7°F | Resp 12 | Ht 69.5 in | Wt 160.0 lb

## 2012-01-12 DIAGNOSIS — E7800 Pure hypercholesterolemia, unspecified: Secondary | ICD-10-CM | POA: Insufficient documentation

## 2012-01-12 DIAGNOSIS — Z0000 Encounter for general adult medical examination without abnormal findings: Secondary | ICD-10-CM

## 2012-01-12 NOTE — Patient Instructions (Addendum)
Consider tapering off omprazole

## 2012-01-12 NOTE — Progress Notes (Signed)
Subjective:    Patient ID: Warren Barrera, male    DOB: 09-Apr-1971, 41 y.o.   MRN: [id number]  HPI  Patient seen for complete physical examination. He has generally been very healthy. Chronic cough several months ago which eventually resolved after taking omeprazole. Possible silent reflux. No active reflux symptoms. He has consider tapering off omeprazole this time.  Very health-conscious. Runs about 25 miles per week. 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 No dizziness. No syncope. Family history significant father premature CAD age 25. Also had maternal uncle with CAD age 22. Patient nonsmoker. No history of diabetes. No history of hypertension. Patient had requested an NMR profile to further stratify risk and this was obtained on recent lab work  Past Medical History  Diagnosis Date  . Allergy   . Acute sinusitis, unspecified 04/20/2009   Past Surgical History  Procedure Date  . Knee arthroscopy w/ meniscal repair 1997    reports that he has never smoked. He has never used smokeless tobacco. He reports that he does not drink alcohol. His drug history not on file. family history includes Cancer in an unspecified family member; Heart disease (age of onset:46) in his father; and Hypertension in an unspecified family member. Allergies  Allergen Reactions  . Oxycodone-Acetaminophen     REACTION: rash from Percocet     Review of Systems  Constitutional: Negative for fever, activity change, appetite change and fatigue.  HENT: Negative for ear pain, congestion and trouble swallowing.   Eyes: Negative for pain and visual disturbance.  Respiratory: Negative for cough, shortness of breath and wheezing.   Cardiovascular: Negative for chest pain and palpitations.  Gastrointestinal: Negative for nausea, vomiting, abdominal pain, diarrhea, constipation, blood in stool, abdominal distention and rectal pain.  Genitourinary: Negative for dysuria, hematuria and testicular  pain.  Musculoskeletal: Negative for joint swelling and arthralgias.  Skin: Negative for rash.  Neurological: Negative for dizziness, syncope and headaches.  Hematological: Negative for adenopathy.  Psychiatric/Behavioral: Negative for confusion and dysphoric mood.       Objective:   Physical Exam  Constitutional: He is oriented to person, place, and time. He appears well-developed and well-nourished. No distress.  HENT:  Head: Normocephalic and atraumatic.  Right Ear: External ear normal.  Left Ear: External ear normal.  Mouth/Throat: Oropharynx is clear and moist.  Eyes: Conjunctivae and EOM are normal. Pupils are equal, round, and reactive to light.  Neck: Normal range of motion. Neck supple. No thyromegaly present.  Cardiovascular: Regular rhythm and normal heart sounds.   No murmur heard.      Bradycardic with rate around 44 (this is his baseline and has been so for years)  Pulmonary/Chest: No respiratory distress. He has no wheezes. He has no rales.  Abdominal: Soft. Bowel sounds are normal. He exhibits no distension and no mass. There is no tenderness. There is no rebound and no guarding.  Musculoskeletal: He exhibits no edema.  Lymphadenopathy:    He has no cervical adenopathy.  Neurological: He is alert and oriented to person, place, and time. He displays normal reflexes. No cranial nerve deficit.  Skin: No rash noted.  Psychiatric: He has a normal mood and affect.          Assessment & Plan:  Complete physical. Labs reviewed with patient including recent NMR profile which is fairly favorable. His pattern is more compatible with insulin sensitivity than insulin resistance. Has fairly large LDL particle size. After long discussion of pros and cons of  statin therapy patient wishes to wait at this time. His recent LDL was 139 by NMR profile. He is bradycardic on exam but asymptomatic.  Continue with regular exercise. Try tapering off omeprazole  We also discussed possible  cardiac CT scanning at some point to further stratify risk at this point he wishes to wait

## 2012-09-20 ENCOUNTER — Ambulatory Visit (INDEPENDENT_AMBULATORY_CARE_PROVIDER_SITE_OTHER): Payer: 59 | Admitting: Family Medicine

## 2012-09-20 ENCOUNTER — Encounter: Payer: Self-pay | Admitting: Family Medicine

## 2012-09-20 VITALS — BP 110/70 | Temp 101.7°F

## 2012-09-20 DIAGNOSIS — R509 Fever, unspecified: Secondary | ICD-10-CM

## 2012-09-20 DIAGNOSIS — R05 Cough: Secondary | ICD-10-CM

## 2012-09-20 MED ORDER — AZITHROMYCIN 250 MG PO TABS: ORAL_TABLET | ORAL | Status: AC

## 2012-09-20 NOTE — Progress Notes (Signed)
  Subjective:    Patient ID: Warren Barrera, male    DOB: November 28, 1970, 42 y.o.   MRN: [id number]  HPI Patient seen with 2 week history of cough Mostly productive cough. Was doing reasonably well and even ran several miles yesterday but last night developed chills and worsening headache. He has some sore throat and minimal nasal congestion. Fever today up to 101. Denies any dyspnea. No nausea or vomiting. No skin rash. 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  Past Medical History  Diagnosis Date  . Allergy   . Acute sinusitis, unspecified 04/20/2009   Past Surgical History  Procedure Laterality Date  . Knee arthroscopy w/ meniscal repair  1997    reports that he has never smoked. He has never used smokeless tobacco. He reports that he does not drink alcohol. His drug history is not on file. family history includes Cancer in an unspecified family member; Heart disease (age of onset: 73) in his father; and Hypertension in an unspecified family member. Allergies  Allergen Reactions  . Oxycodone-Acetaminophen     REACTION: rash from Percocet      Review of Systems  Constitutional: Positive for fever and chills.  HENT: Positive for sore throat.   Respiratory: Positive for cough. Negative for shortness of breath.   Gastrointestinal: Negative for nausea and vomiting.  Neurological: Positive for headaches.       Objective:   Physical Exam  HENT:  Right Ear: External ear normal.  Left Ear: External ear normal.  Mouth/Throat: Oropharynx is clear and moist.  Neck: Neck supple.  Cardiovascular: Normal rate and regular rhythm.   Pulmonary/Chest: Effort normal. He has no wheezes.  Patient has some questionable faint rhonchi/rales left base. These cleared somewhat with deep breathing. No wheezing. No retractions  Lymphadenopathy:    He has no cervical adenopathy.  Skin: No rash noted.          Assessment & Plan:  Febrile illness with cough. This may be viral but concern is  fever developing 2 weeks into course of illness. He is nontoxic and has been saturating 97% and no red flags to indicate need for admission. Check CBC with differential. Start Zithromax. Consider chest x-ray if symptoms worsen.

## 2012-09-20 NOTE — Patient Instructions (Addendum)
Follow up promptly for any vomiting, shortness of breath, or any worsening symptoms.

## 2012-09-21 LAB — CBC WITH DIFFERENTIAL/PLATELET
Eosinophils Relative: 0.5 % (ref 0.0–5.0)
HCT: 40.5 % (ref 39.0–52.0)
Monocytes Relative: 10.7 % (ref 3.0–12.0)
Neutrophils Relative %: 81.4 % — ABNORMAL HIGH (ref 43.0–77.0)
Platelets: 244 10*3/uL (ref 150.0–400.0)
WBC: 12.7 10*3/uL — ABNORMAL HIGH (ref 4.5–10.5)

## 2012-09-22 NOTE — Progress Notes (Signed)
Quick Note:  Pt informed on work and cell personally identified VM ______

## 2013-01-18 ENCOUNTER — Other Ambulatory Visit (INDEPENDENT_AMBULATORY_CARE_PROVIDER_SITE_OTHER): Payer: 59

## 2013-01-18 DIAGNOSIS — Z0000 Encounter for general adult medical examination without abnormal findings: Secondary | ICD-10-CM

## 2013-01-18 LAB — BASIC METABOLIC PANEL
CO2: 29 mEq/L (ref 19–32)
Chloride: 102 mEq/L (ref 96–112)
Glucose, Bld: 90 mg/dL (ref 70–99)
Potassium: 3.5 mEq/L (ref 3.5–5.1)
Sodium: 139 mEq/L (ref 135–145)

## 2013-01-18 LAB — CBC WITH DIFFERENTIAL/PLATELET
Basophils Absolute: 0 10*3/uL (ref 0.0–0.1)
Eosinophils Absolute: 0.2 10*3/uL (ref 0.0–0.7)
Hemoglobin: 15.9 g/dL (ref 13.0–17.0)
Lymphocytes Relative: 27.2 % (ref 12.0–46.0)
MCHC: 33.7 g/dL (ref 30.0–36.0)
Neutro Abs: 3 10*3/uL (ref 1.4–7.7)
Platelets: 213 10*3/uL (ref 150.0–400.0)
RDW: 13.2 % (ref 11.5–14.6)

## 2013-01-18 LAB — LIPID PANEL
Cholesterol: 201 mg/dL — ABNORMAL HIGH (ref 0–200)
HDL: 46.7 mg/dL (ref >39.00)
Triglycerides: 93 mg/dL (ref 0.0–149.0)

## 2013-01-18 LAB — HEPATIC FUNCTION PANEL
Albumin: 4.2 g/dL (ref 3.5–5.2)
Alkaline Phosphatase: 51 U/L (ref 39–117)
Total Bilirubin: 0.6 mg/dL (ref 0.3–1.2)

## 2013-01-18 LAB — POCT URINALYSIS DIPSTICK
Bilirubin, UA: NEGATIVE
Glucose, UA: NEGATIVE
Nitrite, UA: NEGATIVE
Spec Grav, UA: 1.015
Urobilinogen, UA: 0.2

## 2013-01-18 LAB — TSH: TSH: 1.41 u[IU]/mL (ref 0.35–5.50)

## 2013-02-03 ENCOUNTER — Encounter: Payer: 59 | Admitting: Family Medicine

## 2013-02-24 ENCOUNTER — Encounter (HOSPITAL_BASED_OUTPATIENT_CLINIC_OR_DEPARTMENT_OTHER): Admission: RE | Payer: Self-pay | Source: Ambulatory Visit

## 2013-02-24 ENCOUNTER — Inpatient Hospital Stay (HOSPITAL_BASED_OUTPATIENT_CLINIC_OR_DEPARTMENT_OTHER): Admission: RE | Admit: 2013-02-24 | Payer: 59 | Source: Ambulatory Visit | Admitting: Cardiovascular Disease

## 2013-02-24 SURGERY — JV LEFT AND RIGHT HEART CATHETERIZATION WITH CORONARY ANGIOGRAM

## 2013-03-17 ENCOUNTER — Encounter: Payer: Self-pay | Admitting: Family Medicine

## 2013-03-17 ENCOUNTER — Ambulatory Visit (INDEPENDENT_AMBULATORY_CARE_PROVIDER_SITE_OTHER): Payer: 59 | Admitting: Family Medicine

## 2013-03-17 VITALS — BP 122/80 | HR 77 | Temp 97.8°F | Ht 69.0 in | Wt 157.0 lb

## 2013-03-17 DIAGNOSIS — Z0000 Encounter for general adult medical examination without abnormal findings: Secondary | ICD-10-CM

## 2013-03-17 NOTE — Patient Instructions (Addendum)
Calcium Screening Score of the Heart Calcium screening score is an imaging test used to look for calcium deposits on the inner lining of the coronary arteries (blood vessels of the heart). This procedure, also known as a heart scan, is a "non-invasive test" which means no surgery or needles are used. It involves taking pictures using computerized tomography (CT). The amount of radiation exposure is very low. Deposits of calcium and plaques (deposits of fatty materials) can partly clog and narrow the coronary arteries. These deposits can happen without producing symptoms or warning signs. They increase the risk of a heart attack. The calcium screening score is a test that can detect these deposits before symptoms develop.  THIS TEST IS NOT RECOMMENDED IF YOU HAVE A PACEMAKER. PREPARATION FOR TEST   This test does not need any special preparation.  You may continue taking all your usual medicines.  You will need to stop smoking, avoid nicotine gum or patches, and avoid caffeine-containing beverages (such as Coke Classic, Diet Coke, Pepsi, Diet Pepsi, [facility], Dr. Reino Kent and other caffeinated drinks); all coffee (roasted, instant, decaffeinated roasted, decaffeinated instant); hot chocolate (cocoa); tea; and all chocolate (including milk, dark, and Baker[employer]) 4 hours before the test. All of these can make the heart beat faster and can affect accurate test results.  Inform your caregiver if you are pregnant or undergoing any radiation therapy.  It may be helpful to wear comfortable clothes. Avoid wearing clothing that contains metal. Women should avoid wearing an under-wire bra.  You may be advised to remove your dentures, hearing aids, eyeglasses, jewelry, and hairpins before the procedure. PROCEDURE  You will need to lie on a special scanning table.  Electrodes (small metal disks) may be placed on your chest to help record the electrical activity of the heart.  The examination table will be  moved through the machine as the actual CT scanning is performed.  You will be asked to hold your breath for a period of 20-30 seconds while the images are recorded.  You may be asked to wait till the technologist is satisfied with the quality of images. NORMAL FINDINGS Your score may vary from 0 to 400 and above. A score of zero indicates that the test shows no problems. A positive test means that you may be at an increased risk of coronary artery disease (CAD). A score of 400 or above means that the test detects widespread problems. Your risk of having or getting CAD is based on your score:  1-10 indicates low risk of CAD.  11-100 indicates moderate risk of CAD.  101-400 denotes moderately high risk of CAD.  401 and above indicates high risk of CAD. MEANING OF TEST If you have heart disease, the test results can help your caregiver better manage your problems. If narrowing or clogging of your coronary arteries is found with this test, your caregiver can help you decide if additional testing is needed.  Your caregiver may suggest this test even if you have no symptoms of heart disease. He/she may recommend this test if you have one or more of the following risk factors:  Family history of heart disease.  High blood pressure.  High cholesterol.  Diabetes mellitus.  Smoking.  Male aged above 17 or male above 13.  Overweight.  A stressful life.  Physically inactive. OBTAINING THE TEST RESULTS  Not all test results are available during your visit. If your test results are not back during the visit, make an appointment with your  caregiver to find out the results. Do not assume everything is normal if you have not heard from your caregiver or the medical facility. It is important for you to follow up on all of your test results. Document Released: 12/06/2007 Document Revised: 09/01/2011 Document Reviewed: 12/06/2007 [facility] Patient Information 2014 [place], [place].

## 2013-03-17 NOTE — Progress Notes (Signed)
  Subjective:    Patient ID: Warren Barrera, male    DOB: 11-22-1970, 42 y.o.   MRN: [id number]  HPI Patient here for complete physical Generally feels well. He continues to run about 5 days per week. Got flu vaccine recently through his employer.  Never smoked. Tetanus up-to-date. Positive family history of premature CAD in his father. Patient is otherwise very low risk.  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 Wife is Education officer, community.  Past Medical History  Diagnosis Date  . Allergy   . Acute sinusitis, unspecified 04/20/2009   Past Surgical History  Procedure Laterality Date  . Knee arthroscopy w/ meniscal repair  1997    reports that he has never smoked. He has never used smokeless tobacco. He reports that he does not drink alcohol. His drug history is not on file. family history includes Cancer in an other family member; Heart disease (age of onset: 47) in his father; Hypertension in an other family member. Allergies  Allergen Reactions  . Oxycodone-Acetaminophen     REACTION: rash from Percocet     Review of Systems  Constitutional: Negative for fever, activity change, appetite change and fatigue.  HENT: Negative for ear pain, congestion and trouble swallowing.   Eyes: Negative for pain and visual disturbance.  Respiratory: Negative for cough, shortness of breath and wheezing.   Cardiovascular: Negative for chest pain and palpitations.  Gastrointestinal: Negative for nausea, vomiting, abdominal pain, diarrhea, constipation, blood in stool, abdominal distention and rectal pain.  Genitourinary: Negative for dysuria, hematuria and testicular pain.  Musculoskeletal: Negative for joint swelling and arthralgias.  Skin: Negative for rash.  Neurological: Negative for dizziness, syncope and headaches.  Hematological: Negative for adenopathy.  Psychiatric/Behavioral: Negative for confusion and dysphoric mood.       Objective:   Physical Exam  Constitutional: He is oriented  to person, place, and time. He appears well-developed and well-nourished. No distress.  HENT:  Head: Normocephalic and atraumatic.  Right Ear: External ear normal.  Left Ear: External ear normal.  Mouth/Throat: Oropharynx is clear and moist.  Eyes: Conjunctivae and EOM are normal. Pupils are equal, round, and reactive to light.  Neck: Normal range of motion. Neck supple. No thyromegaly present.  Cardiovascular: Normal rate, regular rhythm and normal heart sounds.   No murmur heard. Pulmonary/Chest: No respiratory distress. He has no wheezes. He has no rales.  Abdominal: Soft. Bowel sounds are normal. He exhibits no distension and no mass. There is no tenderness. There is no rebound and no guarding.  Musculoskeletal: He exhibits no edema.  Lymphadenopathy:    He has no cervical adenopathy.  Neurological: He is alert and oriented to person, place, and time. He displays normal reflexes. No cranial nerve deficit.  Skin: No rash noted.  Psychiatric: He has a normal mood and affect.          Assessment & Plan:  Complete physical. Healthy 42 year old male. We talked about further cardiac screening such as coronary calcium scoring (given positive FH CAD) and at this point he is undecided. Let us know if he has interest. Flu vaccine already given.

## 2013-12-14 ENCOUNTER — Other Ambulatory Visit (INDEPENDENT_AMBULATORY_CARE_PROVIDER_SITE_OTHER): Payer: 59

## 2013-12-14 ENCOUNTER — Other Ambulatory Visit: Payer: 59

## 2013-12-14 DIAGNOSIS — Z0000 Encounter for general adult medical examination without abnormal findings: Secondary | ICD-10-CM

## 2013-12-14 LAB — HEPATIC FUNCTION PANEL
ALBUMIN: 4.5 g/dL (ref 3.5–5.2)
ALK PHOS: 49 U/L (ref 39–117)
ALT: 28 U/L (ref 0–53)
AST: 27 U/L (ref 0–37)
Bilirubin, Direct: 0.1 mg/dL (ref 0.0–0.3)
Total Bilirubin: 0.7 mg/dL (ref 0.2–1.2)
Total Protein: 7.2 g/dL (ref 6.0–8.3)

## 2013-12-14 LAB — CBC WITH DIFFERENTIAL/PLATELET
Basophils Absolute: 0 10*3/uL (ref 0.0–0.1)
Basophils Relative: 0.6 % (ref 0.0–3.0)
EOS PCT: 2.5 % (ref 0.0–5.0)
Eosinophils Absolute: 0.2 10*3/uL (ref 0.0–0.7)
HEMATOCRIT: 48.1 % (ref 39.0–52.0)
HEMOGLOBIN: 16.3 g/dL (ref 13.0–17.0)
LYMPHS ABS: 1.4 10*3/uL (ref 0.7–4.0)
LYMPHS PCT: 18.9 % (ref 12.0–46.0)
MCHC: 33.9 g/dL (ref 30.0–36.0)
MCV: 92.2 fl (ref 78.0–100.0)
MONOS PCT: 9.8 % (ref 3.0–12.0)
Monocytes Absolute: 0.7 10*3/uL (ref 0.1–1.0)
NEUTROS ABS: 5.1 10*3/uL (ref 1.4–7.7)
Neutrophils Relative %: 68.2 % (ref 43.0–77.0)
Platelets: 231 10*3/uL (ref 150.0–400.0)
RBC: 5.21 Mil/uL (ref 4.22–5.81)
RDW: 12.7 % (ref 11.5–15.5)
WBC: 7.5 10*3/uL (ref 4.0–10.5)

## 2013-12-14 LAB — POCT URINALYSIS DIPSTICK
BILIRUBIN UA: NEGATIVE
GLUCOSE UA: NEGATIVE
Ketones, UA: NEGATIVE
Leukocytes, UA: NEGATIVE
NITRITE UA: NEGATIVE
PH UA: 8.5
RBC UA: NEGATIVE
SPEC GRAV UA: 1.015
UROBILINOGEN UA: 0.2

## 2013-12-14 LAB — LIPID PANEL
Cholesterol: 214 mg/dL — ABNORMAL HIGH (ref 0–200)
HDL: 57.1 mg/dL (ref >39.00)
LDL Cholesterol: 143 mg/dL — ABNORMAL HIGH (ref 0–99)
NonHDL: 156.9
Total CHOL/HDL Ratio: 4
Triglycerides: 71 mg/dL (ref 0.0–149.0)
VLDL: 14.2 mg/dL (ref 0.0–40.0)

## 2013-12-14 LAB — BASIC METABOLIC PANEL
BUN: 13 mg/dL (ref 6–23)
CALCIUM: 9.8 mg/dL (ref 8.4–10.5)
CO2: 31 mEq/L (ref 19–32)
Chloride: 103 mEq/L (ref 96–112)
Creatinine, Ser: 0.9 mg/dL (ref 0.4–1.5)
GFR: 93.38 mL/min (ref >60.00)
Glucose, Bld: 81 mg/dL (ref 70–99)
POTASSIUM: 3.6 meq/L (ref 3.5–5.1)
SODIUM: 141 meq/L (ref 135–145)

## 2013-12-14 LAB — TSH: TSH: 1.12 u[IU]/mL (ref 0.35–4.50)

## 2013-12-15 ENCOUNTER — Other Ambulatory Visit: Payer: 59

## 2013-12-20 ENCOUNTER — Telehealth: Payer: Self-pay | Admitting: Family Medicine

## 2013-12-20 ENCOUNTER — Encounter: Payer: Self-pay | Admitting: Family Medicine

## 2013-12-20 ENCOUNTER — Ambulatory Visit (INDEPENDENT_AMBULATORY_CARE_PROVIDER_SITE_OTHER): Payer: 59 | Admitting: Family Medicine

## 2013-12-20 VITALS — BP 126/82 | HR 64 | Temp 97.8°F | Ht 69.0 in | Wt 161.0 lb

## 2013-12-20 DIAGNOSIS — Z8249 Family history of ischemic heart disease and other diseases of the circulatory system: Secondary | ICD-10-CM

## 2013-12-20 DIAGNOSIS — Z0000 Encounter for general adult medical examination without abnormal findings: Secondary | ICD-10-CM

## 2013-12-20 NOTE — Patient Instructions (Signed)
Cost for CT cardiac scoring is $150 and is not covered by most insurance Let me know if you're interested in setting this up   Coronary Calcium Scan A coronary calcium scan is an imaging test used to look for deposits of calcium and other fatty materials (plaques) in the inner lining of the blood vessels of your heart (coronary arteries). These deposits of calcium and plaques can partly clog and narrow the coronary arteries without producing any symptoms or warning signs. This puts you at risk for a heart attack. This test can detect these deposits before symptoms develop.  LET [facility] LLCYOUR HEALTH CARE PROVIDER KNOW ABOUT:  Any allergies you have.  All medicines you are taking, including vitamins, herbs, eye drops, creams, and over-the-counter medicines.  Previous problems you or members of your family have had with the use of anesthetics.  Any blood disorders you have.  Previous surgeries you have had.  Medical conditions you have.  Possibility of pregnancy, if this applies. RISKS AND COMPLICATIONS Generally, this is a safe procedure. However, as with any procedure, complications can occur. This test involves the use of radiation. Radiation exposure can be dangerous to a pregnant woman and her unborn baby. If you are pregnant, you should not have this procedure done.  BEFORE THE PROCEDURE There is no special preparation for the procedure. PROCEDURE  You will need to undress and put on a hospital gown. You will need to remove any jewelry around your neck or chest.  Sticky electrodes are placed on your chest and are connected to an electrocardiogram (EKG or electrocardiography) machine to recorda tracing of the electrical activity of your heart.  A CT scanner will take pictures of your heart. During this time, you will be asked to lie still and hold your breath for 2-3 seconds while a picture is being taken of your heart. AFTER THE PROCEDURE   You will be allowed to get dressed.  You can  return to your normal activities after the scan is done. Document Released: 12/06/2007 Document Revised: 06/14/2013 Document Reviewed: 02/14/2013 [facility] Patient Information 2015 [place], [place]. This information is not intended to replace advice given to you by your health care provider. Make sure you discuss any questions you have with your health care provider.

## 2013-12-20 NOTE — Telephone Encounter (Signed)
Pt called to request that you set him up for the Coronary Calcium Scan that you and him discuss

## 2013-12-20 NOTE — Progress Notes (Signed)
   Subjective:    Patient ID: Warren Barrera, male    DOB: [birth year], 43 y.o.   MRN: [id number]  HPI Patient seen for complete physical. Generally very healthy. He exercises with running about 25 miles per week. Father had history of premature CAD in his 11040s. Patient had mild hyperlipidemia in the past. No recent chest pains. Never smoked. Takes no prescription medications. Tetanus up-to-date.  Reviewed with no major changes:  Past Medical History  Diagnosis Date  . Allergy   . Acute sinusitis, unspecified 04/20/2009   Past Surgical History  Procedure Laterality Date  . Knee arthroscopy w/ meniscal repair  1997    reports that he has never smoked. He has never used smokeless tobacco. He reports that he does not drink alcohol. His drug history is not on file. family history includes Cancer in an other family member; Heart disease (age of onset: 4146) in his father; Hypertension in an other family member. Allergies  Allergen Reactions  . Oxycodone-Acetaminophen     REACTION: rash from Percocet      Review of Systems  Constitutional: Negative for fever, activity change, appetite change and fatigue.  HENT: Negative for congestion, ear pain and trouble swallowing.   Eyes: Negative for pain and visual disturbance.  Respiratory: Negative for cough, shortness of breath and wheezing.   Cardiovascular: Negative for chest pain and palpitations.  Gastrointestinal: Negative for nausea, vomiting, abdominal pain, diarrhea, constipation, blood in stool, abdominal distention and rectal pain.  Genitourinary: Negative for dysuria, hematuria and testicular pain.  Musculoskeletal: Negative for arthralgias and joint swelling.  Skin: Negative for rash.  Neurological: Negative for dizziness, syncope and headaches.  Hematological: Negative for adenopathy.  Psychiatric/Behavioral: Negative for confusion and dysphoric mood.       Objective:   Physical Exam  Constitutional: He is oriented to person,  place, and time. He appears well-developed and well-nourished. No distress.  HENT:  Head: Normocephalic and atraumatic.  Right Ear: External ear normal.  Left Ear: External ear normal.  Mouth/Throat: Oropharynx is clear and moist.  Eyes: Conjunctivae and EOM are normal. Pupils are equal, round, and reactive to light.  Neck: Normal range of motion. Neck supple. No thyromegaly present.  Cardiovascular: Normal rate, regular rhythm and normal heart sounds.   No murmur heard. Pulmonary/Chest: No respiratory distress. He has no wheezes. He has no rales.  Abdominal: Soft. Bowel sounds are normal. He exhibits no distension and no mass. There is no tenderness. There is no rebound and no guarding.  Musculoskeletal: He exhibits no edema.  Lymphadenopathy:    He has no cervical adenopathy.  Neurological: He is alert and oriented to person, place, and time. He displays normal reflexes. No cranial nerve deficit.  Skin: No rash noted.  Psychiatric: He has a normal mood and affect.          Assessment & Plan:  Complete physical. Continue regular exercise habits and healthy lifestyle habits. Labs reviewed. No major abnormalities. Ten-year risk of CAD event is 3%. We discussed possible CT cardiac scoring given strong family history of premature CAD. Patient will consider.

## 2013-12-20 NOTE — Progress Notes (Signed)
Pre visit review using our clinic review tool, if applicable. No additional management support is needed unless otherwise documented below in the visit note. 

## 2013-12-21 NOTE — Telephone Encounter (Signed)
Pt is aware.  

## 2013-12-21 NOTE — Telephone Encounter (Signed)
Let him know I will order CT cardiac scoring.

## 2013-12-22 ENCOUNTER — Other Ambulatory Visit: Payer: 59

## 2013-12-26 ENCOUNTER — Other Ambulatory Visit: Payer: 59

## 2014-01-10 ENCOUNTER — Ambulatory Visit (INDEPENDENT_AMBULATORY_CARE_PROVIDER_SITE_OTHER)
Admission: RE | Admit: 2014-01-10 | Discharge: 2014-01-10 | Disposition: A | Discharge status: Home / Self Care | Payer: Self-pay | Source: Ambulatory Visit | Attending: Family Medicine | Admitting: Family Medicine

## 2014-01-10 DIAGNOSIS — Z8249 Family history of ischemic heart disease and other diseases of the circulatory system: Secondary | ICD-10-CM

## 2014-02-03 ENCOUNTER — Telehealth: Payer: Self-pay | Admitting: Family Medicine

## 2014-02-03 NOTE — Telephone Encounter (Signed)
Pt waiting to hear back about cardiac CT score

## 2014-02-06 NOTE — Telephone Encounter (Signed)
Thought we had already notified him.  NO calcium was detected so this would be a low risk study.

## 2014-02-06 NOTE — Telephone Encounter (Signed)
Left detailed message on pt Vm.  

## 2014-07-10 ENCOUNTER — Ambulatory Visit (INDEPENDENT_AMBULATORY_CARE_PROVIDER_SITE_OTHER): Payer: Managed Care, Other (non HMO) | Admitting: Family Medicine

## 2014-07-10 ENCOUNTER — Encounter: Payer: Self-pay | Admitting: Family Medicine

## 2014-07-10 VITALS — BP 126/78 | HR 74 | Temp 97.4°F | Wt 164.0 lb

## 2014-07-10 DIAGNOSIS — R109 Unspecified abdominal pain: Secondary | ICD-10-CM

## 2014-07-10 LAB — POCT URINALYSIS DIPSTICK
Bilirubin, UA: NEGATIVE
Blood, UA: NEGATIVE
GLUCOSE UA: NEGATIVE
Ketones, UA: NEGATIVE
LEUKOCYTES UA: NEGATIVE
Nitrite, UA: NEGATIVE
PH UA: 7.5
PROTEIN UA: NEGATIVE
Spec Grav, UA: 1.01
Urobilinogen, UA: 0.2

## 2014-07-10 NOTE — Progress Notes (Signed)
Pre visit review using our clinic review tool, if applicable. No additional management support is needed unless otherwise documented below in the visit note. 

## 2014-07-10 NOTE — Progress Notes (Signed)
   Subjective:    Patient ID: Warren Barrera, male    DOB: 09/07/1970, 44 y.o.   MRN: [id number]  HPI Acute visit for right flank pain. Onset about one month ago. He is not aware of any specific injury. His pain is mostly right flank with only occasional radiation anterior. His pain is sharp and worse with bending and twisting to the right side. No pain at rest. He has not tried any heat or ice or any medications for alleviation. No rashes. No associated fever, appetite or weight changes. No change in bowel habits. No dysuria. No history of kidney stones. No gross hematuria. Pain is only minimally tender to touch. Denies any abdominal pain.  Generally very healthy with no chronic medical problems. Still running regularly and has no pain with running  Past Medical History  Diagnosis Date  . Allergy   . Acute sinusitis, unspecified 04/20/2009   Past Surgical History  Procedure Laterality Date  . Knee arthroscopy w/ meniscal repair  1997    reports that he has never smoked. He has never used smokeless tobacco. He reports that he does not drink alcohol. His drug history is not on file. family history includes Cancer in an other family member; Heart disease (age of onset: 4646) in his father; Hypertension in an other family member. Allergies  Allergen Reactions  . Oxycodone-Acetaminophen     REACTION: rash from Percocet      Review of Systems  Constitutional: Negative for fever, chills, appetite change and unexpected weight change.  Respiratory: Negative for shortness of breath.   Cardiovascular: Negative for chest pain.  Gastrointestinal: Negative for nausea, vomiting and abdominal pain.  Genitourinary: Positive for flank pain. Negative for dysuria and hematuria.  Neurological: Negative for dizziness.       Objective:   Physical Exam  Constitutional: He appears well-developed and well-nourished.  Cardiovascular: Normal rate and regular rhythm.   Pulmonary/Chest: Effort normal and  breath sounds normal. No respiratory distress. He has no wheezes. He has no rales.  Musculoskeletal:  No spinal tenderness. He's able to flex back 90 without difficulty with only minimal pain. No reproducible tenderness other than minimal muscular tenderness right upper lumbar area  Skin: No rash noted.          Assessment & Plan:  Right flank pain. Suspect musculoskeletal. The fact this is worse with twisting and bending makes this much more likely musculoskeletal. Check urinalysis. If normal consider physical therapy. Also consider stretches and symptomatic treatment with over-the-counter nonsteroidal  Urine dipstick unremarkable. We gave him handout for some home stretches. Over-the-counter Aleve or Advil as needed. Touch base in 1-2 weeks if not improving

## 2014-07-10 NOTE — Patient Instructions (Signed)
Low Back Sprain with Rehab  A sprain is an injury in which a ligament is torn. The ligaments of the lower back are vulnerable to sprains. However, they are strong and require great force to be injured. These ligaments are important for stabilizing the spinal column. Sprains are classified into three categories. Grade 1 sprains cause pain, but the tendon is not lengthened. Grade 2 sprains include a lengthened ligament, due to the ligament being stretched or partially ruptured. With grade 2 sprains there is still function, although the function may be decreased. Grade 3 sprains involve a complete tear of the tendon or muscle, and function is usually impaired. SYMPTOMS   Severe pain in the lower back.  Sometimes, a feeling of a "pop," "snap," or tear, at the time of injury.  Tenderness and sometimes swelling at the injury site.  Uncommonly, bruising (contusion) within 48 hours of injury.  Muscle spasms in the back. CAUSES  Low back sprains occur when a force is placed on the ligaments that is greater than they can handle. Common causes of injury include:  Performing a stressful act while off-balance.  Repetitive stressful activities that involve movement of the lower back.  Direct hit (trauma) to the lower back. RISK INCREASES WITH:  Contact sports (football, wrestling).  Collisions (major skiing accidents).  Sports that require throwing or lifting (baseball, weightlifting).  Sports involving twisting of the spine (gymnastics, diving, tennis, golf).  Poor strength and flexibility.  Inadequate protection.  Previous back injury or surgery (especially fusion). PREVENTION  Wear properly fitted and padded protective equipment.  Warm up and stretch properly before activity.  Allow for adequate recovery between workouts.  Maintain physical fitness:  Strength, flexibility, and endurance.  Cardiovascular fitness.  Maintain a healthy body weight. PROGNOSIS  If treated  properly, low back sprains usually heal with non-surgical treatment. The length of time for healing depends on the severity of the injury.  RELATED COMPLICATIONS   Recurring symptoms, resulting in a chronic problem.  Chronic inflammation and pain in the low back.  Delayed healing or resolution of symptoms, especially if activity is resumed too soon.  Prolonged impairment.  Unstable or arthritic joints of the low back. TREATMENT  Treatment first involves the use of ice and medicine, to reduce pain and inflammation. The use of strengthening and stretching exercises may help reduce pain with activity. These exercises may be performed at home or with a therapist. Severe injuries may require referral to a therapist for further evaluation and treatment, such as ultrasound. Your caregiver may advise that you wear a back brace or corset, to help reduce pain and discomfort. Often, prolonged bed rest results in greater harm then benefit. Corticosteroid injections may be recommended. However, these should be reserved for the most serious cases. It is important to avoid using your back when lifting objects. At night, sleep on your back on a firm mattress, with a pillow placed under your knees. If non-surgical treatment is unsuccessful, surgery may be needed.  MEDICATION   If pain medicine is needed, nonsteroidal anti-inflammatory medicines (aspirin and ibuprofen), or other minor pain relievers (acetaminophen), are often advised.  Do not take pain medicine for 7 days before surgery.  Prescription pain relievers may be given, if your caregiver thinks they are needed. Use only as directed and only as much as you need.  Ointments applied to the skin may be helpful.  Corticosteroid injections may be given by your caregiver. These injections should be reserved for the most serious cases,   because they may only be given a certain number of times. HEAT AND COLD  Cold treatment (icing) should be applied for 10  to 15 minutes every 2 to 3 hours for inflammation and pain, and immediately after activity that aggravates your symptoms. Use ice packs or an ice massage.  Heat treatment may be used before performing stretching and strengthening activities prescribed by your caregiver, physical therapist, or athletic trainer. Use a heat pack or a warm water soak. SEEK MEDICAL CARE IF:   Symptoms get worse or do not improve in 2 to 4 weeks, despite treatment.  You develop numbness or weakness in either leg.  You lose bowel or bladder function.  Any of the following occur after surgery: fever, increased pain, swelling, redness, drainage of fluids, or bleeding in the affected area.  New, unexplained symptoms develop. (Drugs used in treatment may produce side effects.) EXERCISES  RANGE OF MOTION (ROM) AND STRETCHING EXERCISES - Low Back Sprain Most people with lower back pain will find that their symptoms get worse with excessive bending forward (flexion) or arching at the lower back (extension). The exercises that will help resolve your symptoms will focus on the opposite motion.  Your physician, physical therapist or athletic trainer will help you determine which exercises will be most helpful to resolve your lower back pain. Do not complete any exercises without first consulting with your caregiver. Discontinue any exercises which make your symptoms worse, until you speak to your caregiver. If you have pain, numbness or tingling which travels down into your buttocks, leg or foot, the goal of the therapy is for these symptoms to move closer to your back and eventually resolve. Sometimes, these leg symptoms will get better, but your lower back pain may worsen. This is often an indication of progress in your rehabilitation. Be very alert to any changes in your symptoms and the activities in which you participated in the 24 hours prior to the change. Sharing this information with your caregiver will allow him or her to  most efficiently treat your condition. These exercises may help you when beginning to rehabilitate your injury. Your symptoms may resolve with or without further involvement from your physician, physical therapist or athletic trainer. While completing these exercises, remember:   Restoring tissue flexibility helps normal motion to return to the joints. This allows healthier, less painful movement and activity.  An effective stretch should be held for at least 30 seconds.  A stretch should never be painful. You should only feel a gentle lengthening or release in the stretched tissue. FLEXION RANGE OF MOTION AND STRETCHING EXERCISES: STRETCH - Flexion, Single Knee to Chest   Lie on a firm bed or floor with both legs extended in front of you.  Keeping one leg in contact with the floor, bring your opposite knee to your chest. Hold your leg in place by either grabbing behind your thigh or at your knee.  Pull until you feel a gentle stretch in your low back. Hold __________ seconds.  Slowly release your grasp and repeat the exercise with the opposite side. Repeat __________ times. Complete this exercise __________ times per day.  STRETCH - Flexion, Double Knee to Chest  Lie on a firm bed or floor with both legs extended in front of you.  Keeping one leg in contact with the floor, bring your opposite knee to your chest.  Tense your stomach muscles to support your back and then lift your other knee to your chest. Hold your legs   in place by either grabbing behind your thighs or at your knees.  Pull both knees toward your chest until you feel a gentle stretch in your low back. Hold __________ seconds.  Tense your stomach muscles and slowly return one leg at a time to the floor. Repeat __________ times. Complete this exercise __________ times per day.  STRETCH - Low Trunk Rotation  Lie on a firm bed or floor. Keeping your legs in front of you, bend your knees so they are both pointed toward the  ceiling and your feet are flat on the floor.  Extend your arms out to the side. This will stabilize your upper body by keeping your shoulders in contact with the floor.  Gently and slowly drop both knees together to one side until you feel a gentle stretch in your low back. Hold for __________ seconds.  Tense your stomach muscles to support your lower back as you bring your knees back to the starting position. Repeat the exercise to the other side. Repeat __________ times. Complete this exercise __________ times per day  EXTENSION RANGE OF MOTION AND FLEXIBILITY EXERCISES: STRETCH - Extension, Prone on Elbows   Lie on your stomach on the floor, a bed will be too soft. Place your palms about shoulder width apart and at the height of your head.  Place your elbows under your shoulders. If this is too painful, stack pillows under your chest.  Allow your body to relax so that your hips drop lower and make contact more completely with the floor.  Hold this position for __________ seconds.  Slowly return to lying flat on the floor. Repeat __________ times. Complete this exercise __________ times per day.  RANGE OF MOTION - Extension, Prone Press Ups  Lie on your stomach on the floor, a bed will be too soft. Place your palms about shoulder width apart and at the height of your head.  Keeping your back as relaxed as possible, slowly straighten your elbows while keeping your hips on the floor. You may adjust the placement of your hands to maximize your comfort. As you gain motion, your hands will come more underneath your shoulders.  Hold this position __________ seconds.  Slowly return to lying flat on the floor. Repeat __________ times. Complete this exercise __________ times per day.  RANGE OF MOTION- Quadruped, Neutral Spine   Assume a hands and knees position on a firm surface. Keep your hands under your shoulders and your knees under your hips. You may place padding under your knees for  comfort.  Drop your head and point your tailbone toward the ground below you. This will round out your lower back like an angry cat. Hold this position for __________ seconds.  Slowly lift your head and release your tail bone so that your back sags into a large arch, like an old horse.  Hold this position for __________ seconds.  Repeat this until you feel limber in your low back.  Now, find your "sweet spot." This will be the most comfortable position somewhere between the two previous positions. This is your neutral spine. Once you have found this position, tense your stomach muscles to support your low back.  Hold this position for __________ seconds. Repeat __________ times. Complete this exercise __________ times per day.  STRENGTHENING EXERCISES - Low Back Sprain These exercises may help you when beginning to rehabilitate your injury. These exercises should be done near your "sweet spot." This is the neutral, low-back arch, somewhere between fully rounded   and fully arched, that is your least painful position. When performed in this safe range of motion, these exercises can be used for people who have either a flexion or extension based injury. These exercises may resolve your symptoms with or without further involvement from your physician, physical therapist or athletic trainer. While completing these exercises, remember:   Muscles can gain both the endurance and the strength needed for everyday activities through controlled exercises.  Complete these exercises as instructed by your physician, physical therapist or athletic trainer. Increase the resistance and repetitions only as guided.  You may experience muscle soreness or fatigue, but the pain or discomfort you are trying to eliminate should never worsen during these exercises. If this pain does worsen, stop and make certain you are following the directions exactly. If the pain is still present after adjustments, discontinue the  exercise until you can discuss the trouble with your caregiver. STRENGTHENING - Deep Abdominals, Pelvic Tilt   Lie on a firm bed or floor. Keeping your legs in front of you, bend your knees so they are both pointed toward the ceiling and your feet are flat on the floor.  Tense your lower abdominal muscles to press your low back into the floor. This motion will rotate your pelvis so that your tail bone is scooping upwards rather than pointing at your feet or into the floor. With a gentle tension and even breathing, hold this position for __________ seconds. Repeat __________ times. Complete this exercise __________ times per day.  STRENGTHENING - Abdominals, Crunches   Lie on a firm bed or floor. Keeping your legs in front of you, bend your knees so they are both pointed toward the ceiling and your feet are flat on the floor. Cross your arms over your chest.  Slightly tip your chin down without bending your neck.  Tense your abdominals and slowly lift your trunk high enough to just clear your shoulder blades. Lifting higher can put excessive stress on the lower back and does not further strengthen your abdominal muscles.  Control your return to the starting position. Repeat __________ times. Complete this exercise __________ times per day.  STRENGTHENING - Quadruped, Opposite UE/LE Lift   Assume a hands and knees position on a firm surface. Keep your hands under your shoulders and your knees under your hips. You may place padding under your knees for comfort.  Find your neutral spine and gently tense your abdominal muscles so that you can maintain this position. Your shoulders and hips should form a rectangle that is parallel with the floor and is not twisted.  Keeping your trunk steady, lift your right hand no higher than your shoulder and then your left leg no higher than your hip. Make sure you are not holding your breath. Hold this position for __________ seconds.  Continuing to keep  your abdominal muscles tense and your back steady, slowly return to your starting position. Repeat with the opposite arm and leg. Repeat __________ times. Complete this exercise __________ times per day.  STRENGTHENING - Abdominals and Quadriceps, Straight Leg Raise   Lie on a firm bed or floor with both legs extended in front of you.  Keeping one leg in contact with the floor, bend the other knee so that your foot can rest flat on the floor.  Find your neutral spine, and tense your abdominal muscles to maintain your spinal position throughout the exercise.  Slowly lift your straight leg off the floor about 6 inches for a count   of 15, making sure to not hold your breath.  Still keeping your neutral spine, slowly lower your leg all the way to the floor. Repeat this exercise with each leg __________ times. Complete this exercise __________ times per day. POSTURE AND BODY MECHANICS CONSIDERATIONS - Low Back Sprain Keeping correct posture when sitting, standing or completing your activities will reduce the stress put on different body tissues, allowing injured tissues a chance to heal and limiting painful experiences. The following are general guidelines for improved posture. Your physician or physical therapist will provide you with any instructions specific to your needs. While reading these guidelines, remember:  The exercises prescribed by your provider will help you have the flexibility and strength to maintain correct postures.  The correct posture provides the best environment for your joints to work. All of your joints have less wear and tear when properly supported by a spine with good posture. This means you will experience a healthier, less painful body.  Correct posture must be practiced with all of your activities, especially prolonged sitting and standing. Correct posture is as important when doing repetitive low-stress activities (typing) as it is when doing a single heavy-load  activity (lifting). RESTING POSITIONS Consider which positions are most painful for you when choosing a resting position. If you have pain with flexion-based activities (sitting, bending, stooping, squatting), choose a position that allows you to rest in a less flexed posture. You would want to avoid curling into a fetal position on your side. If your pain worsens with extension-based activities (prolonged standing, working overhead), avoid resting in an extended position such as sleeping on your stomach. Most people will find more comfort when they rest with their spine in a more neutral position, neither too rounded nor too arched. Lying on a non-sagging bed on your side with a pillow between your knees, or on your back with a pillow under your knees will often provide some relief. Keep in mind, being in any one position for a prolonged period of time, no matter how correct your posture, can still lead to stiffness. PROPER SITTING POSTURE In order to minimize stress and discomfort on your spine, you must sit with correct posture. Sitting with good posture should be effortless for a healthy body. Returning to good posture is a gradual process. Many people can work toward this most comfortably by using various supports until they have the flexibility and strength to maintain this posture on their own. When sitting with proper posture, your ears will fall over your shoulders and your shoulders will fall over your hips. You should use the back of the chair to support your upper back. Your lower back will be in a neutral position, just slightly arched. You may place a small pillow or folded towel at the base of your lower back for  support.  When working at a desk, create an environment that supports good, upright posture. Without extra support, muscles tire, which leads to excessive strain on joints and other tissues. Keep these recommendations in mind: CHAIR:  A chair should be able to slide under your desk  when your back makes contact with the back of the chair. This allows you to work closely.  The chair's height should allow your eyes to be level with the upper part of your monitor and your hands to be slightly lower than your elbows. BODY POSITION  Your feet should make contact with the floor. If this is not possible, use a foot rest.  Keep your   ears over your shoulders. This will reduce stress on your neck and low back. INCORRECT SITTING POSTURES  If you are feeling tired and unable to assume a healthy sitting posture, do not slouch or slump. This puts excessive strain on your back tissues, causing more damage and pain. Healthier options include:  Using more support, like a lumbar pillow.  Switching tasks to something that requires you to be upright or walking.  Talking a brief walk.  Lying down to rest in a neutral-spine position. PROLONGED STANDING WHILE SLIGHTLY LEANING FORWARD  When completing a task that requires you to lean forward while standing in one place for a long time, place either foot up on a stationary 2-4 inch high object to help maintain the best posture. When both feet are on the ground, the lower back tends to lose its slight inward curve. If this curve flattens (or becomes too large), then the back and your other joints will experience too much stress, tire more quickly, and can cause pain. CORRECT STANDING POSTURES Proper standing posture should be assumed with all daily activities, even if they only take a few moments, like when brushing your teeth. As in sitting, your ears should fall over your shoulders and your shoulders should fall over your hips. You should keep a slight tension in your abdominal muscles to brace your spine. Your tailbone should point down to the ground, not behind your body, resulting in an over-extended swayback posture.  INCORRECT STANDING POSTURES  Common incorrect standing postures include a forward head, locked knees and/or an excessive  swayback. WALKING Walk with an upright posture. Your ears, shoulders and hips should all line-up. PROLONGED ACTIVITY IN A FLEXED POSITION When completing a task that requires you to bend forward at your waist or lean over a low surface, try to find a way to stabilize 3 out of 4 of your limbs. You can place a hand or elbow on your thigh or rest a knee on the surface you are reaching across. This will provide you more stability, so that your muscles do not tire as quickly. By keeping your knees relaxed, or slightly bent, you will also reduce stress across your lower back. CORRECT LIFTING TECHNIQUES DO :  Assume a wide stance. This will provide you more stability and the opportunity to get as close as possible to the object which you are lifting.  Tense your abdominals to brace your spine. Bend at the knees and hips. Keeping your back locked in a neutral-spine position, lift using your leg muscles. Lift with your legs, keeping your back straight.  Test the weight of unknown objects before attempting to lift them.  Try to keep your elbows locked down at your sides in order get the best strength from your shoulders when carrying an object.  Always ask for help when lifting heavy or awkward objects. INCORRECT LIFTING TECHNIQUES DO NOT:   Lock your knees when lifting, even if it is a small object.  Bend and twist. Pivot at your feet or move your feet when needing to change directions.  Assume that you can safely pick up even a paperclip without proper posture. Document Released: 06/09/2005 Document Revised: 09/01/2011 Document Reviewed: 09/21/2008 [employer] Patient Information 2015 [employer]. This information is not intended to replace advice given to you by your health care provider. Make sure you discuss any questions you have with your health care provider.  

## 2015-01-09 ENCOUNTER — Other Ambulatory Visit (INDEPENDENT_AMBULATORY_CARE_PROVIDER_SITE_OTHER): Payer: Managed Care, Other (non HMO)

## 2015-01-09 DIAGNOSIS — Z0000 Encounter for general adult medical examination without abnormal findings: Secondary | ICD-10-CM | POA: Diagnosis not present

## 2015-01-09 LAB — LIPID PANEL
Cholesterol: 192 mg/dL (ref 0–200)
HDL: 50.3 mg/dL (ref >39.00)
LDL Cholesterol: 125 mg/dL — ABNORMAL HIGH (ref 0–99)
NonHDL: 141.7
TRIGLYCERIDES: 82 mg/dL (ref 0.0–149.0)
Total CHOL/HDL Ratio: 4
VLDL: 16.4 mg/dL (ref 0.0–40.0)

## 2015-01-09 LAB — HEPATIC FUNCTION PANEL
ALT: 20 U/L (ref 0–53)
AST: 21 U/L (ref 0–37)
Albumin: 4.1 g/dL (ref 3.5–5.2)
Alkaline Phosphatase: 52 U/L (ref 39–117)
BILIRUBIN DIRECT: 0.1 mg/dL (ref 0.0–0.3)
Total Bilirubin: 0.3 mg/dL (ref 0.2–1.2)
Total Protein: 6.4 g/dL (ref 6.0–8.3)

## 2015-01-09 LAB — TSH: TSH: 1.14 u[IU]/mL (ref 0.35–4.50)

## 2015-01-09 LAB — CBC WITH DIFFERENTIAL/PLATELET
BASOS ABS: 0 10*3/uL (ref 0.0–0.1)
Basophils Relative: 0.5 % (ref 0.0–3.0)
EOS PCT: 2.4 % (ref 0.0–5.0)
Eosinophils Absolute: 0.1 10*3/uL (ref 0.0–0.7)
HEMATOCRIT: 45.7 % (ref 39.0–52.0)
Hemoglobin: 15.6 g/dL (ref 13.0–17.0)
LYMPHS ABS: 1.1 10*3/uL (ref 0.7–4.0)
Lymphocytes Relative: 17.6 % (ref 12.0–46.0)
MCHC: 34.2 g/dL (ref 30.0–36.0)
MCV: 91.7 fl (ref 78.0–100.0)
Monocytes Absolute: 0.5 10*3/uL (ref 0.1–1.0)
Monocytes Relative: 8.8 % (ref 3.0–12.0)
NEUTROS ABS: 4.4 10*3/uL (ref 1.4–7.7)
Neutrophils Relative %: 70.7 % (ref 43.0–77.0)
PLATELETS: 220 10*3/uL (ref 150.0–400.0)
RBC: 4.99 Mil/uL (ref 4.22–5.81)
RDW: 13.1 % (ref 11.5–15.5)
WBC: 6.2 10*3/uL (ref 4.0–10.5)

## 2015-01-09 LAB — BASIC METABOLIC PANEL
BUN: 16 mg/dL (ref 6–23)
CO2: 30 mEq/L (ref 19–32)
Calcium: 9.4 mg/dL (ref 8.4–10.5)
Chloride: 102 mEq/L (ref 96–112)
Creatinine, Ser: 0.81 mg/dL (ref 0.40–1.50)
GFR: 108.99 mL/min (ref >60.00)
Glucose, Bld: 88 mg/dL (ref 70–99)
POTASSIUM: 3.9 meq/L (ref 3.5–5.1)
SODIUM: 138 meq/L (ref 135–145)

## 2015-01-15 ENCOUNTER — Encounter: Payer: Self-pay | Admitting: Family Medicine

## 2015-01-15 ENCOUNTER — Ambulatory Visit (INDEPENDENT_AMBULATORY_CARE_PROVIDER_SITE_OTHER): Payer: Managed Care, Other (non HMO) | Admitting: Family Medicine

## 2015-01-15 VITALS — BP 108/60 | HR 58 | Temp 98.1°F | Resp 20 | Ht 69.0 in | Wt 162.5 lb

## 2015-01-15 DIAGNOSIS — Z0000 Encounter for general adult medical examination without abnormal findings: Secondary | ICD-10-CM | POA: Diagnosis not present

## 2015-01-15 DIAGNOSIS — R911 Solitary pulmonary nodule: Secondary | ICD-10-CM | POA: Diagnosis not present

## 2015-01-15 NOTE — Patient Instructions (Signed)
We will call you with limited CT of chest to evaluate prior right middle lobe nodule.

## 2015-01-15 NOTE — Progress Notes (Signed)
   Subjective:    Patient ID: Warren Barrera, male    DOB: 1970-11-05, 44 y.o.   MRN: [id number]  HPI Patient here for complete physical. Generally very healthy. He continues to exercise regularly. His father had coronary disease prematurely. Patient had coronary calcium score last year of 0. He had incidental note of 5 mm right middle lobe nodule. Never smoked. No cough or other sniffed side effects. Needs follow-up scan to reassess. Takes no medications. Tetanus up-to-date.  Past Medical History  Diagnosis Date  . Allergy   . Acute sinusitis, unspecified 04/20/2009   Past Surgical History  Procedure Laterality Date  . Knee arthroscopy w/ meniscal repair  1997    reports that he has never smoked. He has never used smokeless tobacco. He reports that he does not drink alcohol. His drug history is not on file. family history includes Cancer in an other family member; Heart disease (age of onset: 46) in his father; Hypertension in an other family member. Allergies  Allergen Reactions  . Oxycodone-Acetaminophen     REACTION: rash from Percocet      Review of Systems  Constitutional: Negative for fever, activity change, appetite change and fatigue.  HENT: Negative for congestion, ear pain and trouble swallowing.   Eyes: Negative for pain and visual disturbance.  Respiratory: Negative for cough, shortness of breath and wheezing.   Cardiovascular: Negative for chest pain and palpitations.  Gastrointestinal: Negative for nausea, vomiting, abdominal pain, diarrhea, constipation, blood in stool, abdominal distention and rectal pain.  Genitourinary: Negative for dysuria, hematuria and testicular pain.  Musculoskeletal: Negative for joint swelling and arthralgias.  Skin: Negative for rash.  Neurological: Negative for dizziness, syncope and headaches.  Hematological: Negative for adenopathy.  Psychiatric/Behavioral: Negative for confusion and dysphoric mood.       Objective:   Physical  Exam  Constitutional: He is oriented to person, place, and time. He appears well-developed and well-nourished. No distress.  HENT:  Head: Normocephalic and atraumatic.  Right Ear: External ear normal.  Left Ear: External ear normal.  Mouth/Throat: Oropharynx is clear and moist.  Eyes: Conjunctivae and EOM are normal. Pupils are equal, round, and reactive to light.  Neck: Normal range of motion. Neck supple. No thyromegaly present.  Cardiovascular: Normal rate, regular rhythm and normal heart sounds.   No murmur heard. Pulmonary/Chest: No respiratory distress. He has no wheezes. He has no rales.  Abdominal: Soft. Bowel sounds are normal. He exhibits no distension and no mass. There is no tenderness. There is no rebound and no guarding.  Musculoskeletal: He exhibits no edema.  Lymphadenopathy:    He has no cervical adenopathy.  Neurological: He is alert and oriented to person, place, and time. He displays normal reflexes. No cranial nerve deficit.  Skin: No rash noted.  Psychiatric: He has a normal mood and affect.          Assessment & Plan:  Complete physical. Labs reviewed with no major concerns. Continue regular exercise habits. Check limited CT chest to reevaluate previously noted 5 mm right middle lobe nodule noted during the course of coronary calcium score year ago

## 2015-01-15 NOTE — Progress Notes (Signed)
Pre visit review using our clinic review tool, if applicable. No additional management support is needed unless otherwise documented below in the visit note. 

## 2015-01-18 ENCOUNTER — Ambulatory Visit (INDEPENDENT_AMBULATORY_CARE_PROVIDER_SITE_OTHER)
Admission: RE | Admit: 2015-01-18 | Discharge: 2015-01-18 | Disposition: A | Discharge status: Home / Self Care | Payer: Managed Care, Other (non HMO) | Source: Ambulatory Visit | Attending: Family Medicine | Admitting: Family Medicine

## 2015-01-18 DIAGNOSIS — R911 Solitary pulmonary nodule: Secondary | ICD-10-CM | POA: Diagnosis not present

## 2015-01-23 ENCOUNTER — Other Ambulatory Visit: Payer: Self-pay | Admitting: Family Medicine

## 2015-01-23 DIAGNOSIS — E041 Nontoxic single thyroid nodule: Secondary | ICD-10-CM

## 2015-01-26 ENCOUNTER — Telehealth: Payer: Self-pay | Admitting: Family Medicine

## 2015-01-26 ENCOUNTER — Ambulatory Visit
Admission: RE | Admit: 2015-01-26 | Discharge: 2015-01-26 | Disposition: A | Discharge status: Home / Self Care | Payer: Managed Care, Other (non HMO) | Source: Ambulatory Visit | Attending: Family Medicine | Admitting: Family Medicine

## 2015-01-26 DIAGNOSIS — E041 Nontoxic single thyroid nodule: Secondary | ICD-10-CM

## 2015-01-26 NOTE — Telephone Encounter (Signed)
Pt would like results of thyroid ultrasound done today. Please call back.

## 2015-01-26 NOTE — Telephone Encounter (Signed)
Pt informed as soon as we get results we will give patient a call.

## 2015-01-26 NOTE — Telephone Encounter (Signed)
This has not been release to [place] yet.

## 2015-01-29 ENCOUNTER — Other Ambulatory Visit: Payer: Self-pay | Admitting: Family Medicine

## 2015-01-29 ENCOUNTER — Telehealth: Payer: Self-pay

## 2015-01-29 ENCOUNTER — Telehealth: Payer: Self-pay | Admitting: Family Medicine

## 2015-01-29 DIAGNOSIS — E041 Nontoxic single thyroid nodule: Secondary | ICD-10-CM

## 2015-01-29 NOTE — Telephone Encounter (Signed)
Pt would like for you to give him a call regarding biopsy, he has some questions.

## 2015-01-29 NOTE — Telephone Encounter (Signed)
Patient called regarding his result from my chart where it stated that the patient needed a fine needle biospy according the radiology report. Patient would like to discuss this further. Please give patient a call back.

## 2015-01-29 NOTE — Telephone Encounter (Signed)
Pt informed about results.

## 2015-01-30 NOTE — Telephone Encounter (Signed)
Called pt to notify him [facility] is the only location in town that can perform the thyroid biopsy ordered by pcp.  Also, notified pt that I contacted [facility] to have pt added to the cancellation list in the event a sooner appointment becomes available.

## 2015-01-31 NOTE — Telephone Encounter (Signed)
Noted  

## 2015-02-21 ENCOUNTER — Other Ambulatory Visit (HOSPITAL_COMMUNITY)
Admission: RE | Admit: 2015-02-21 | Discharge: 2015-02-21 | Disposition: A | Discharge status: Home / Self Care | Payer: Managed Care, Other (non HMO) | Source: Ambulatory Visit | Attending: Radiology | Admitting: Radiology

## 2015-02-21 ENCOUNTER — Ambulatory Visit
Admission: RE | Admit: 2015-02-21 | Discharge: 2015-02-21 | Disposition: A | Discharge status: Home / Self Care | Payer: Managed Care, Other (non HMO) | Source: Ambulatory Visit | Attending: Family Medicine | Admitting: Family Medicine

## 2015-02-21 DIAGNOSIS — E041 Nontoxic single thyroid nodule: Secondary | ICD-10-CM | POA: Diagnosis not present

## 2015-09-28 ENCOUNTER — Ambulatory Visit: Payer: Managed Care, Other (non HMO) | Admitting: Family Medicine

## 2015-10-12 ENCOUNTER — Ambulatory Visit: Payer: Managed Care, Other (non HMO) | Admitting: Family Medicine

## 2015-12-25 IMAGING — CT CT CHEST W/O CM
2 of 3 series · 15 of 36 positions shown, 18 images · IV contrast (Omnipaque 300)
Comparison: 01/10/2014

CLINICAL DATA: Evaluate 5 mm right middle lobe nodule seen on
coronary calcium scan

EXAM:
CT CHEST WITHOUT CONTRAST
TECHNIQUE: Multidetector CT imaging of the chest was performed following the
standard protocol without IV contrast.

[Series 2: chest routine with · axial · 0.71mm/px · z∈[+1048,+1323]mm · 12 of 65 slices shown, 15 images]
[im 5/65  mediastinal]
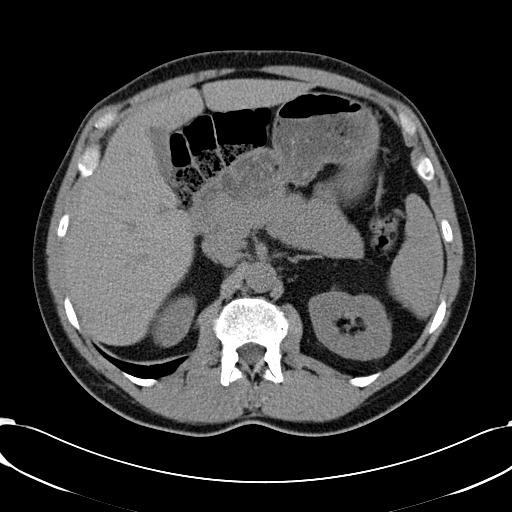
[im 5/65  lung]
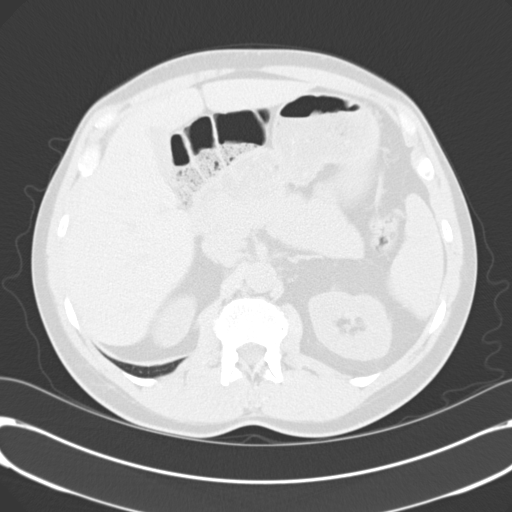
[im 10/65  lung]
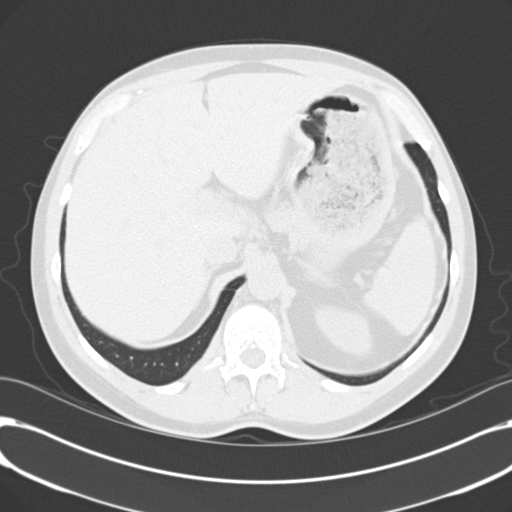
[im 15/65  lung]
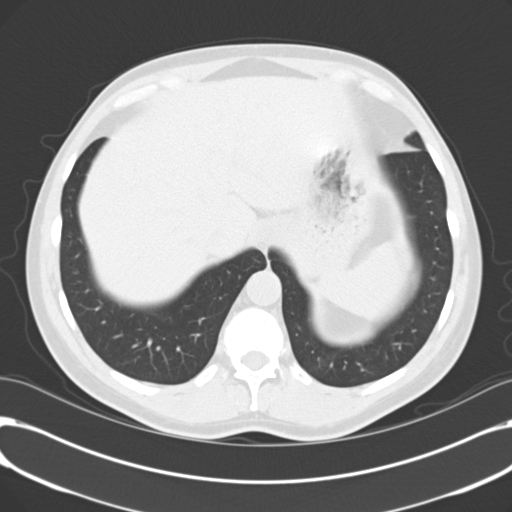
[im 19/65  lung]
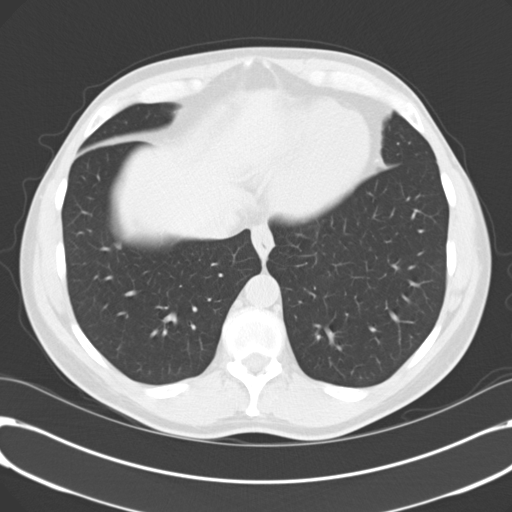
[im 24/65  mediastinal]
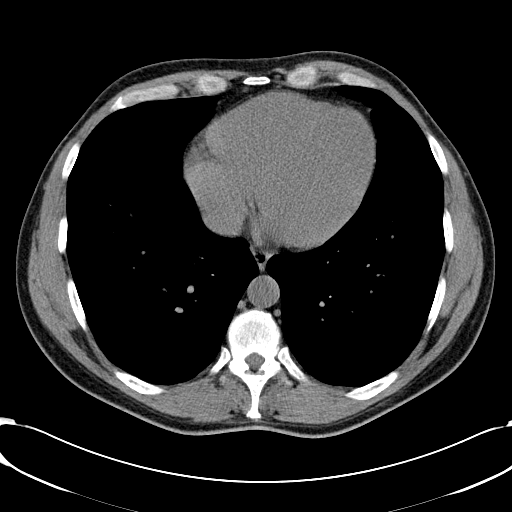
[im 24/65  lung]
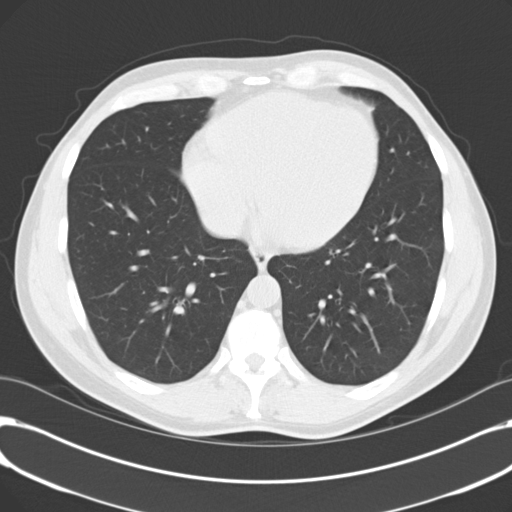
[im 29/65  lung]
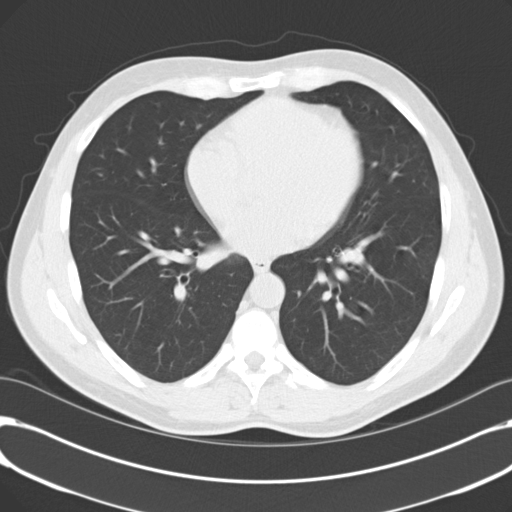
[im 36/65  lung]
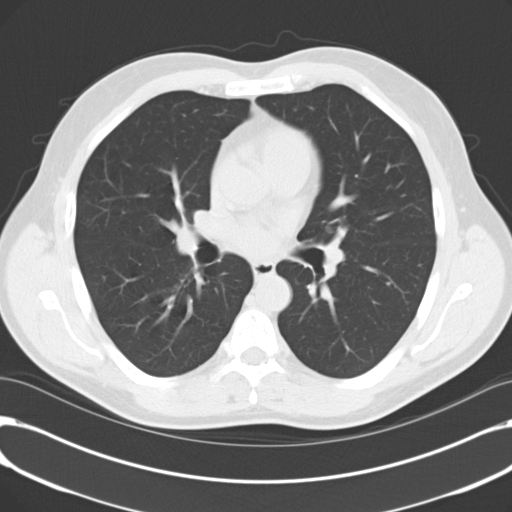
[im 41/65  lung]
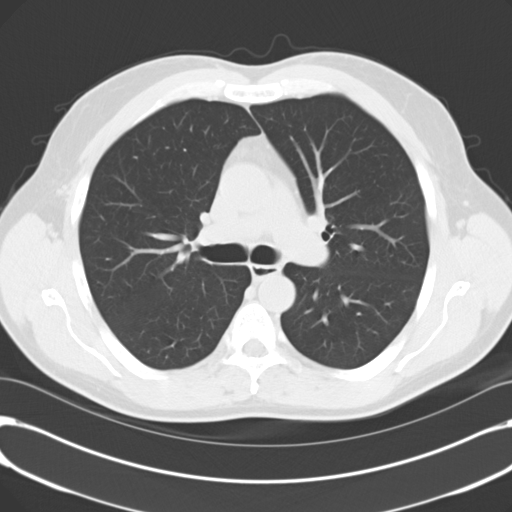
[im 46/65  mediastinal]
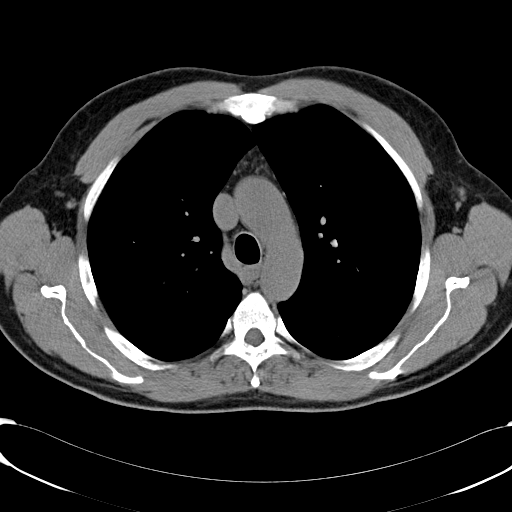
[im 46/65  lung]
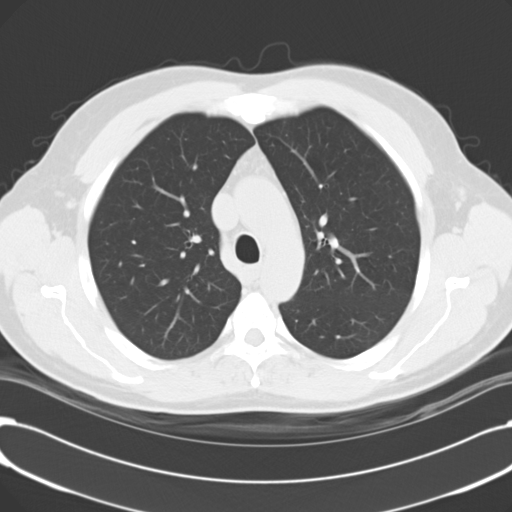
[im 50/65  lung]
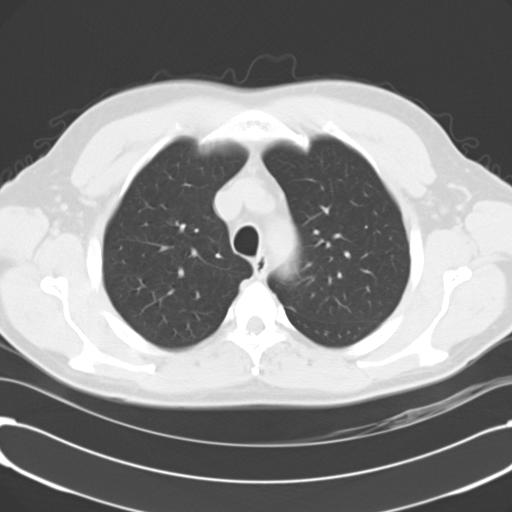
[im 55/65  lung]
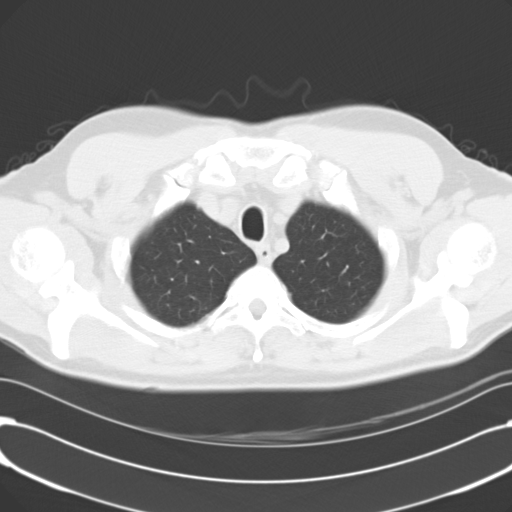
[im 60/65  lung]
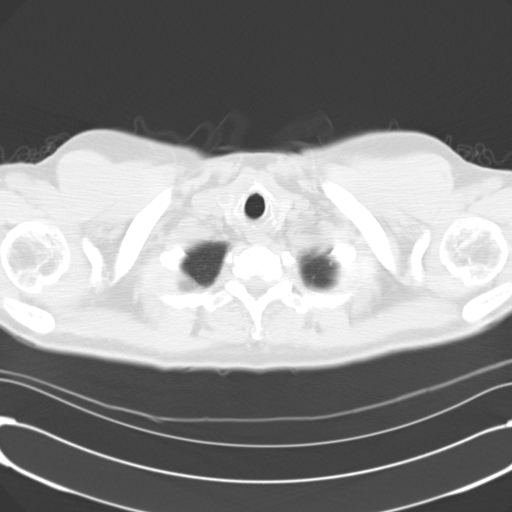

[Series 602: <mpr thick range> · coronal · 0.71mm/px · 3 of 131 slices shown]
[im 27/131  lung]
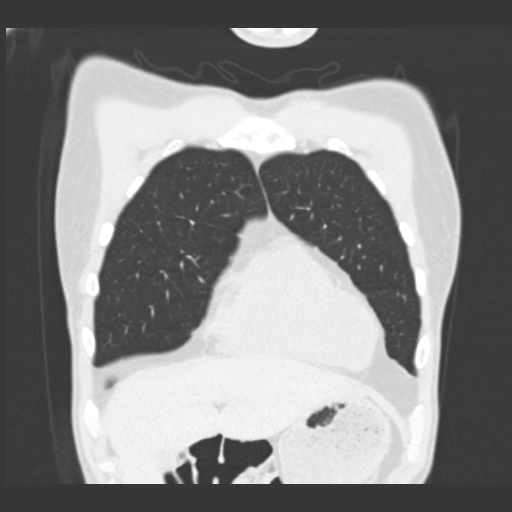
[im 53/131  lung]
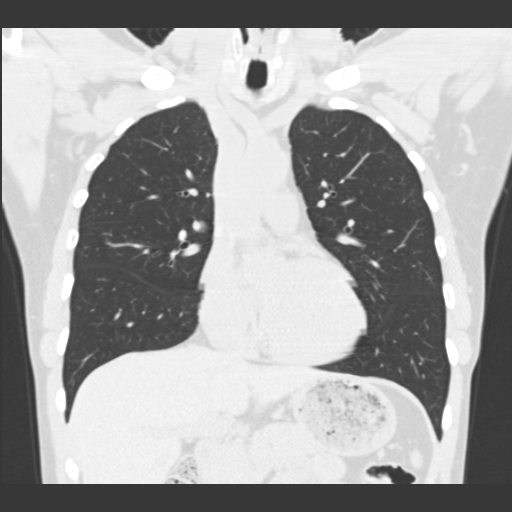
[im 79/131  lung]
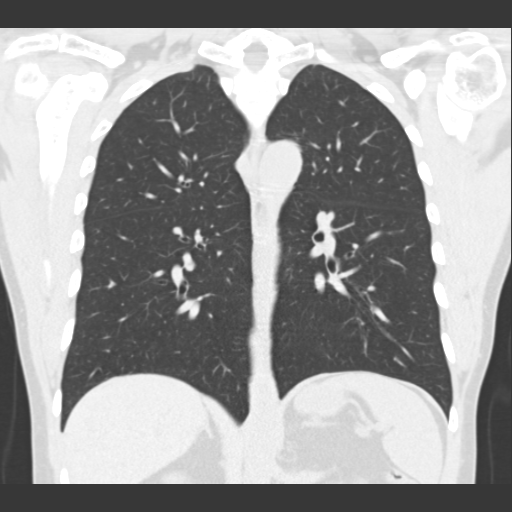

[15 of 36 positions shown; findings below may reference images not displayed]

FINDINGS: Minimal LAD calcification.  No adenopathy or effusion.

5 mm nodule right middle lobe. 4 mm nodule lingula. No consolidation
or infiltrate.

12 mm nodule right thyroid. Mediastinal structures show normal
contours.

No acute musculoskeletal findings. Upper abdomen limited views
negative.
IMPRESSION: 1. 4 mm left and 5 mm right pulmonary nodule. If the patient is at
high risk for bronchogenic carcinoma, follow-up chest CT at 6-12
months is recommended. If the patient is at low risk for
bronchogenic carcinoma, follow-up chest CT at 12 months is
recommended. This recommendation follows the consensus statement:
Guidelines for Management of Small Pulmonary Nodules Detected on CT
Scans: A Statement from the [HOSPITAL] as published in
2. 12 mm thyroid nodule with no suspicious features. No further
follow-up necessary. Consider further evaluation with thyroid
ultrasound. If patient is clinically hyperthyroid, consider nuclear
medicine thyroid uptake and scan.

## 2016-01-02 IMAGING — US US SOFT TISSUE HEAD/NECK
1 series · 14 of 25 positions shown · non-contrast
Comparison: Chest CT 01/18/2015

CLINICAL DATA: 44-year-old male with a history of thyroid nodule.

Nodule identified on prior CT chest 01/18/2015
EXAM:
THYROID ULTRASOUND
TECHNIQUE: Ultrasound examination of the thyroid gland and adjacent soft
tissues was performed.

[Series 1: us soft tissue head/neck · 0.08mm/px · 14 of 43 slices shown]
[im 1/43]
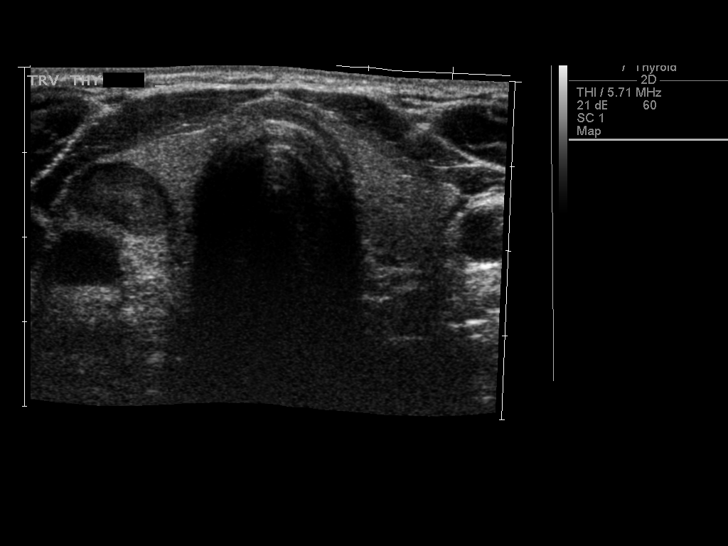
[im 4/43]
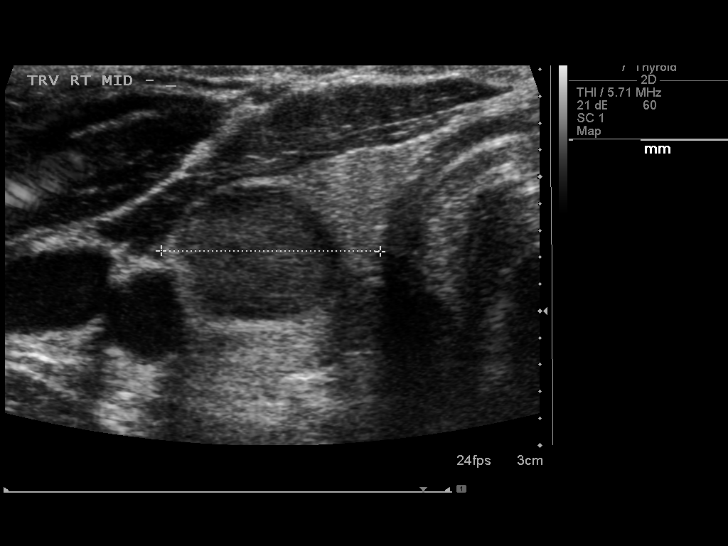
[im 8/43]
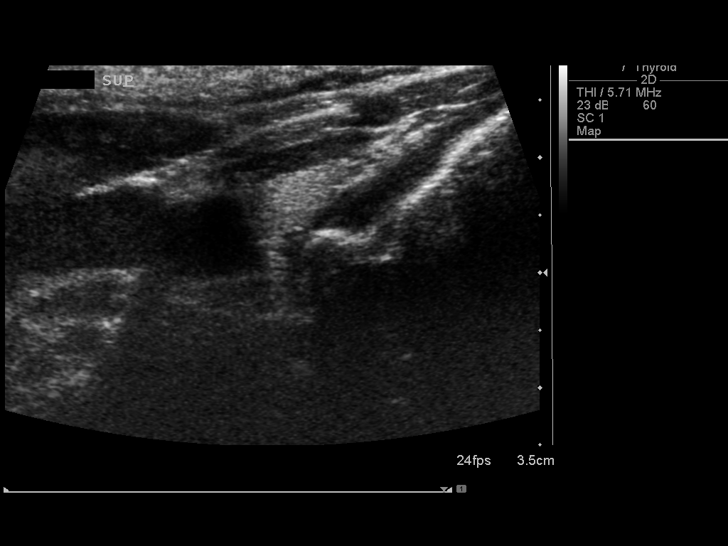
[im 11/43]
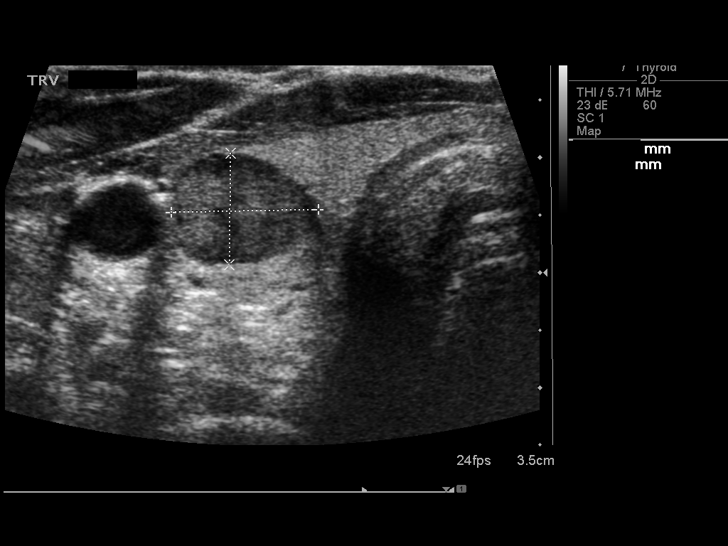
[im 15/43]
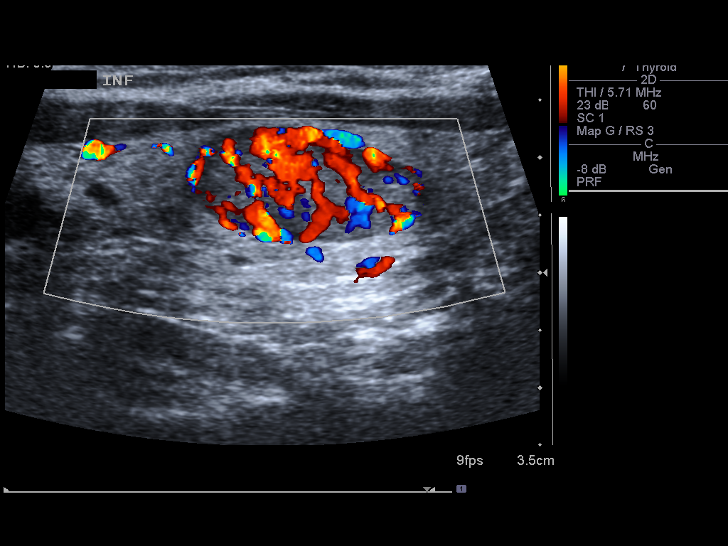
[im 16/43]
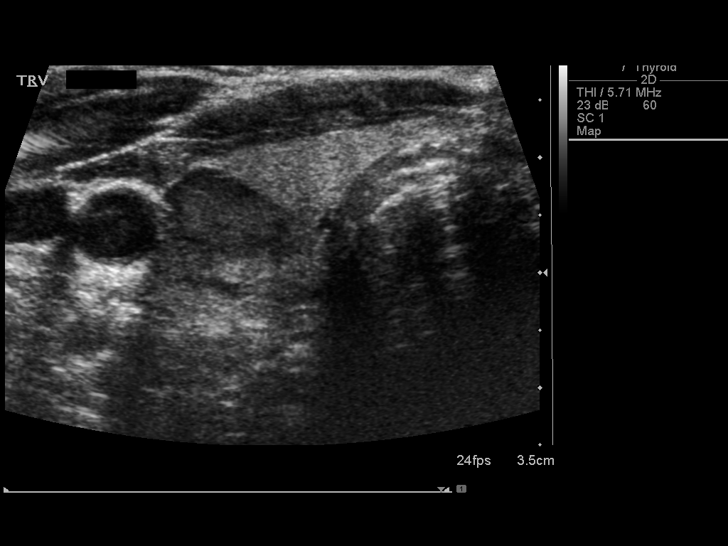
[im 20/43]
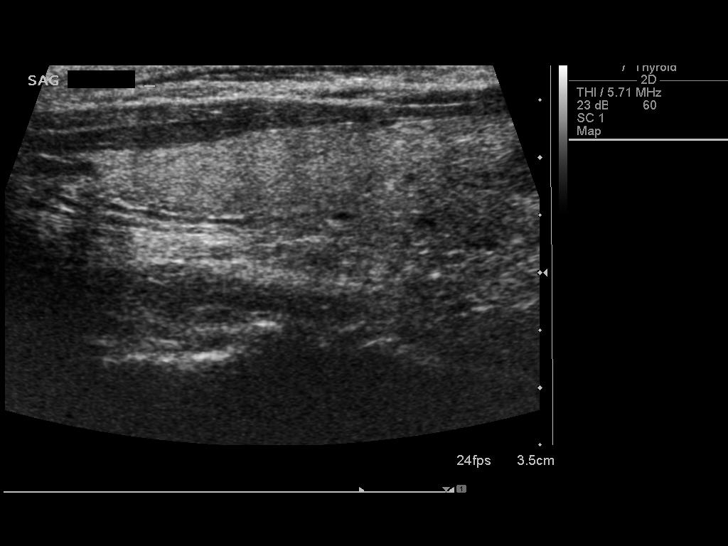
[im 23/43]
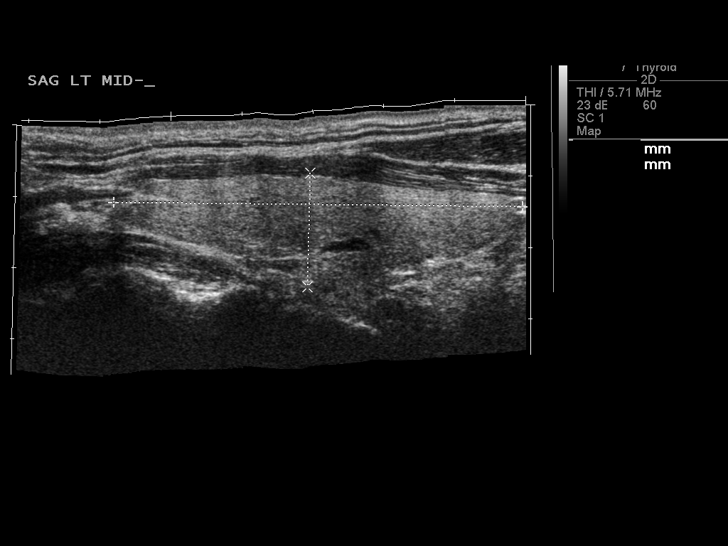
[im 27/43]
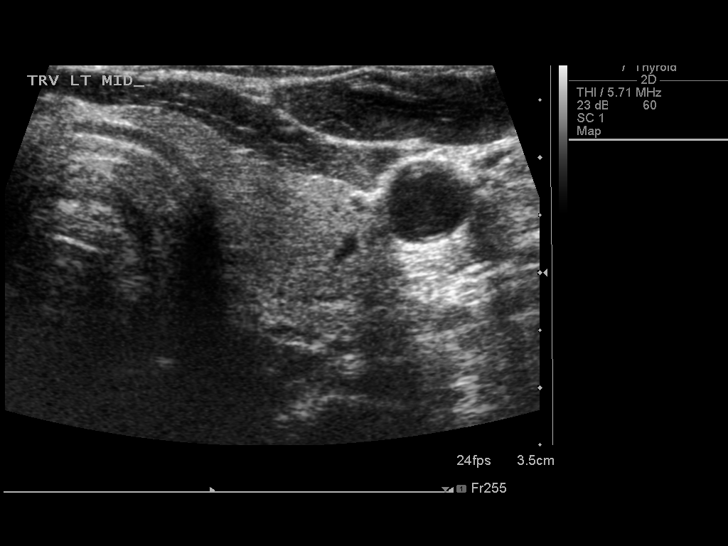
[im 29/43]
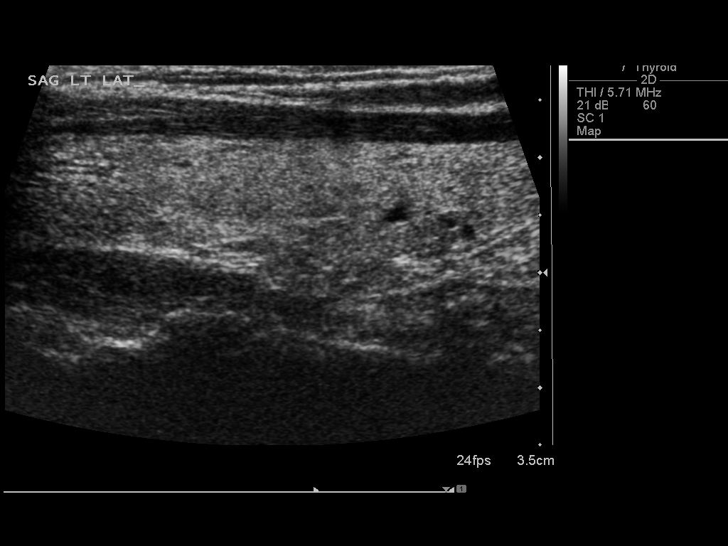
[im 32/43]
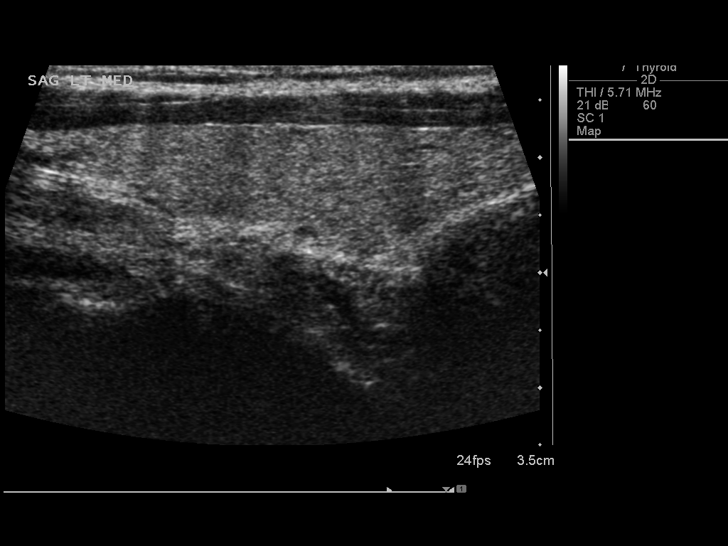
[im 36/43]
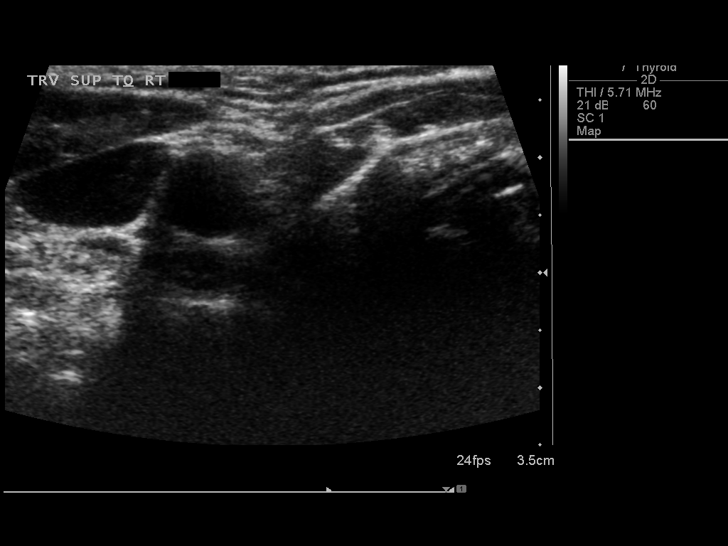
[im 39/43]
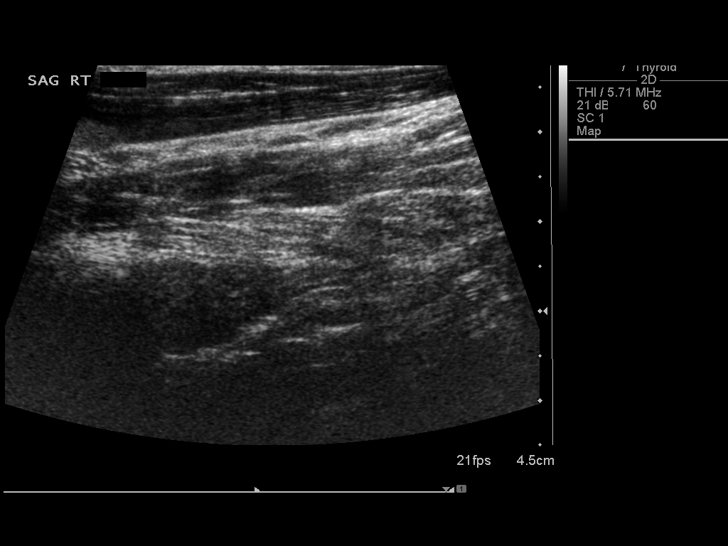
[im 43/43]
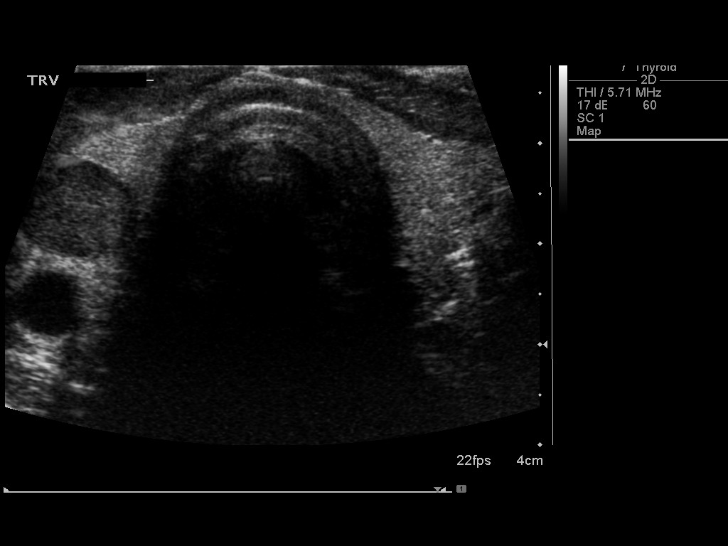

[14 of 25 positions shown; findings below may reference images not displayed]

FINDINGS: Right thyroid lobe

Measurements: 6.1 cm x 1.5 cm x 1.6 cm. Dominant right-sided nodule
measures 1.3 cm by 1.0 cm x 2.0 cm. Internal flow. No internal
reflectors. Hypoechoic margin.

Left thyroid lobe

Measurements: 5.7 cm x 1.6 cm x 1.4 cm.  No nodules visualized.

Isthmus

Thickness: 2 mm.  No nodules visualized.

Lymphadenopathy

None visualized.
IMPRESSION: Single right-sided nodule meets criteria for biopsy by size.

Ultrasound-guided fine needle aspiration should be considered, as
per the consensus statement: Management of Thyroid Nodules Detected
at US: Society of Radiologists in Ultrasound Consensus Conference

## 2016-01-14 ENCOUNTER — Other Ambulatory Visit (INDEPENDENT_AMBULATORY_CARE_PROVIDER_SITE_OTHER): Payer: Managed Care, Other (non HMO)

## 2016-01-14 DIAGNOSIS — Z0000 Encounter for general adult medical examination without abnormal findings: Secondary | ICD-10-CM

## 2016-01-14 LAB — HEPATIC FUNCTION PANEL
ALBUMIN: 4.3 g/dL (ref 3.5–5.2)
ALK PHOS: 49 U/L (ref 39–117)
ALT: 21 U/L (ref 0–53)
AST: 22 U/L (ref 0–37)
Bilirubin, Direct: 0.1 mg/dL (ref 0.0–0.3)
TOTAL PROTEIN: 6.5 g/dL (ref 6.0–8.3)
Total Bilirubin: 0.4 mg/dL (ref 0.2–1.2)

## 2016-01-14 LAB — BASIC METABOLIC PANEL
BUN: 9 mg/dL (ref 6–23)
CO2: 27 mEq/L (ref 19–32)
CREATININE: 0.83 mg/dL (ref 0.40–1.50)
Calcium: 9.6 mg/dL (ref 8.4–10.5)
Chloride: 104 mEq/L (ref 96–112)
GFR: 105.5 mL/min (ref >60.00)
Glucose, Bld: 91 mg/dL (ref 70–99)
Potassium: 3.9 mEq/L (ref 3.5–5.1)
Sodium: 140 mEq/L (ref 135–145)

## 2016-01-14 LAB — CBC WITH DIFFERENTIAL/PLATELET
BASOS ABS: 0 10*3/uL (ref 0.0–0.1)
BASOS PCT: 0.3 % (ref 0.0–3.0)
Eosinophils Absolute: 0.2 10*3/uL (ref 0.0–0.7)
Eosinophils Relative: 3.1 % (ref 0.0–5.0)
HEMATOCRIT: 45.2 % (ref 39.0–52.0)
Hemoglobin: 15.5 g/dL (ref 13.0–17.0)
LYMPHS ABS: 1.3 10*3/uL (ref 0.7–4.0)
Lymphocytes Relative: 26.5 % (ref 12.0–46.0)
MCHC: 34.2 g/dL (ref 30.0–36.0)
MCV: 91.3 fl (ref 78.0–100.0)
MONOS PCT: 12 % (ref 3.0–12.0)
Monocytes Absolute: 0.6 10*3/uL (ref 0.1–1.0)
NEUTROS ABS: 2.9 10*3/uL (ref 1.4–7.7)
NEUTROS PCT: 58.1 % (ref 43.0–77.0)
PLATELETS: 211 10*3/uL (ref 150.0–400.0)
RBC: 4.95 Mil/uL (ref 4.22–5.81)
RDW: 12.8 % (ref 11.5–15.5)
WBC: 5 10*3/uL (ref 4.0–10.5)

## 2016-01-14 LAB — LIPID PANEL
CHOLESTEROL: 208 mg/dL — AB (ref 0–200)
HDL: 47.9 mg/dL (ref >39.00)
LDL Cholesterol: 135 mg/dL — ABNORMAL HIGH (ref 0–99)
NONHDL: 160.14
TRIGLYCERIDES: 126 mg/dL (ref 0.0–149.0)
Total CHOL/HDL Ratio: 4
VLDL: 25.2 mg/dL (ref 0.0–40.0)

## 2016-01-14 LAB — TSH: TSH: 1.65 u[IU]/mL (ref 0.35–4.50)

## 2016-01-21 ENCOUNTER — Encounter: Payer: Self-pay | Admitting: Family Medicine

## 2016-01-21 ENCOUNTER — Ambulatory Visit (INDEPENDENT_AMBULATORY_CARE_PROVIDER_SITE_OTHER): Payer: Managed Care, Other (non HMO) | Admitting: Family Medicine

## 2016-01-21 VITALS — BP 110/80 | HR 77 | Temp 98.7°F | Ht 69.0 in | Wt 168.7 lb

## 2016-01-21 DIAGNOSIS — Z0000 Encounter for general adult medical examination without abnormal findings: Secondary | ICD-10-CM

## 2016-01-21 NOTE — Progress Notes (Signed)
Subjective:     Patient ID: Warren Barrera, male   DOB: January 06, 1971, 45 y.o.   MRN: [id number]  HPI Patient seen for physical exam. Generally very healthy. He still runs few days per week. He had a fall with running back in February and landed on his right shoulder. Went to orthopedist. Had MRI which reportedly showed partial rotator cuff tear with partial labrum tear. He was injected with steroid March and his had some gradual slow improvement still has occasional pains. No significant weakness.  Takes no regular medications. Tetanus up-to-date. Positive family history premature CAD in his father. Patient had coronary calcium score years ago that was normal  Never smoked.  Past Medical History:  Diagnosis Date  . Acute sinusitis, unspecified 04/20/2009  . Allergy    Past Surgical History:  Procedure Laterality Date  . KNEE ARTHROSCOPY W/ MENISCAL REPAIR  1997    reports that he has never smoked. He has never used smokeless tobacco. He reports that he does not drink alcohol or use drugs. family history includes Heart attack in his paternal grandfather and paternal grandmother; Heart disease (age of onset: 58) in his father; Hyperlipidemia in his mother. Allergies  Allergen Reactions  . Oxycodone-Acetaminophen     REACTION: rash from Percocet     Review of Systems  Constitutional: Negative for activity change, appetite change, fatigue and fever.  HENT: Negative for congestion, ear pain and trouble swallowing.   Eyes: Negative for pain and visual disturbance.  Respiratory: Negative for cough, shortness of breath and wheezing.   Cardiovascular: Negative for chest pain and palpitations.  Gastrointestinal: Negative for abdominal distention, abdominal pain, blood in stool, constipation, diarrhea, nausea, rectal pain and vomiting.  Genitourinary: Negative for dysuria, hematuria and testicular pain.  Musculoskeletal: Negative for arthralgias and joint swelling.  Skin: Negative for rash.   Neurological: Negative for dizziness, syncope and headaches.  Hematological: Negative for adenopathy.  Psychiatric/Behavioral: Negative for confusion and dysphoric mood.       Objective:   Physical Exam  Constitutional: He is oriented to person, place, and time. He appears well-developed and well-nourished. No distress.  HENT:  Head: Normocephalic and atraumatic.  Right Ear: External ear normal.  Left Ear: External ear normal.  Mouth/Throat: Oropharynx is clear and moist.  Eyes: Conjunctivae and EOM are normal. Pupils are equal, round, and reactive to light.  Neck: Normal range of motion. Neck supple. No thyromegaly present.  Cardiovascular: Normal rate, regular rhythm and normal heart sounds.   No murmur heard. Pulmonary/Chest: No respiratory distress. He has no wheezes. He has no rales.  Abdominal: Soft. Bowel sounds are normal. He exhibits no distension and no mass. There is no tenderness. There is no rebound and no guarding.  Musculoskeletal: He exhibits no edema.  Lymphadenopathy:    He has no cervical adenopathy.  Neurological: He is alert and oriented to person, place, and time. He displays normal reflexes. No cranial nerve deficit.  Skin: No rash noted.  Psychiatric: He has a normal mood and affect.       Assessment:     Physical exam. Generally healthy 45 year old male. Labs reviewed with patient. 10 year risk for CAD event 2.2%    Plan:     -Continue regular exercise habits -Do more consistent strengthening exercises for right rotator cuff -Continue with yearly flu vaccine -Screening colonoscopy by age 81 unless indicated sooner  Kristian Covey MD [facility] at [facility]

## 2016-01-21 NOTE — Progress Notes (Signed)
Pre visit review using our clinic review tool, if applicable. No additional management support is needed unless otherwise documented below in the visit note. 

## 2016-01-28 IMAGING — US US THYROID BIOPSY
1 series · 13 of 13 positions shown · non-contrast
Comparison: US Soft Tissue Head/Neck 01/27/15

MEDICATIONS:
None

COMPLICATIONS:
None immediate

INDICATION: Indeterminate thyroid nodule, request for fine needle aspiration.

EXAM:
ULTRASOUND GUIDED FINE NEEDLE ASPIRATION OF INDETERMINATE THYROID
NODULE
TECHNIQUE: Informed written consent was obtained from the patient after a
discussion of the risks, benefits and alternatives to treatment.
Questions regarding the procedure were encouraged and answered. A
timeout was performed prior to the initiation of the procedure.

[Series 1: us thyroid biopsy · 0.07mm/px · 13 acquisitions, 13 frames shown]
[im 1/13]
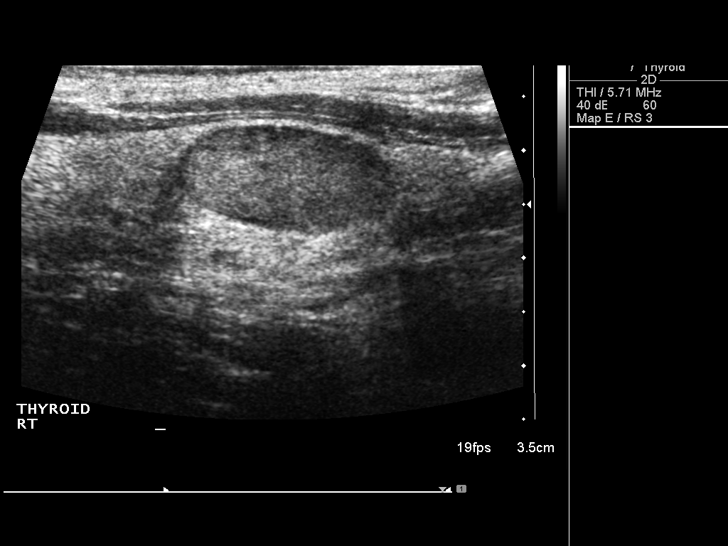
[im 2/13]
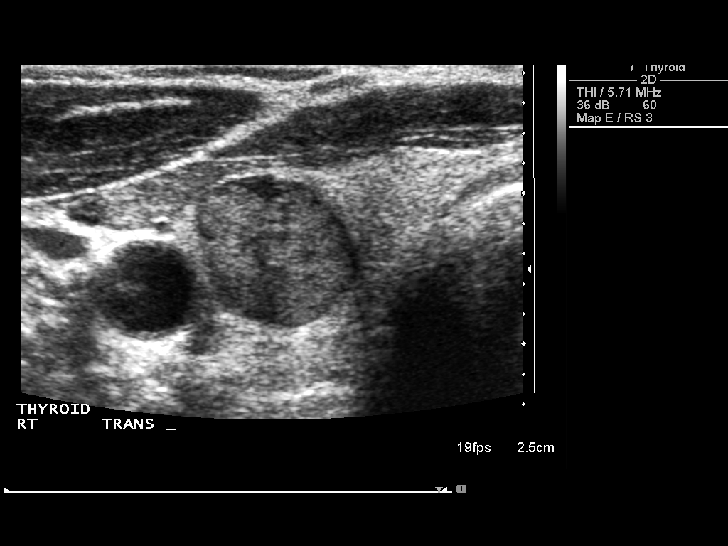
[im 3/13]
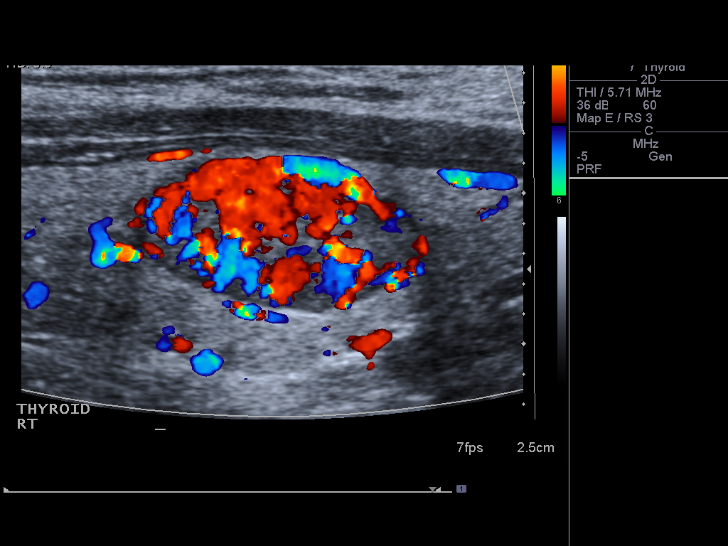
[im 4/13]
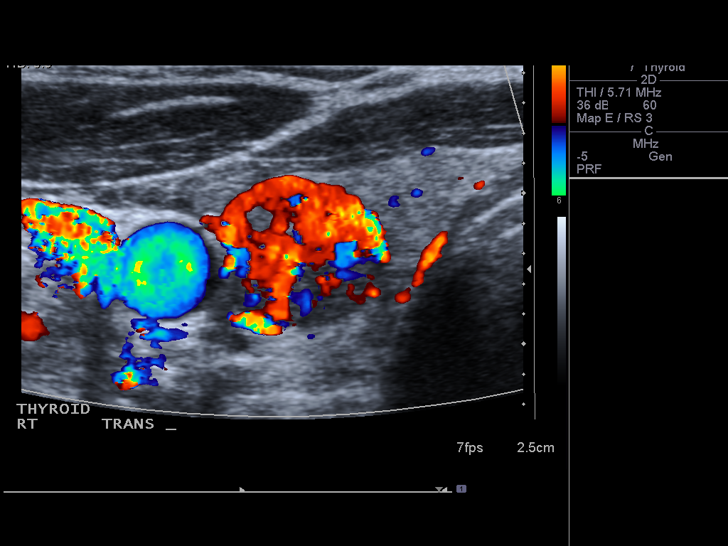
[im 5/13]
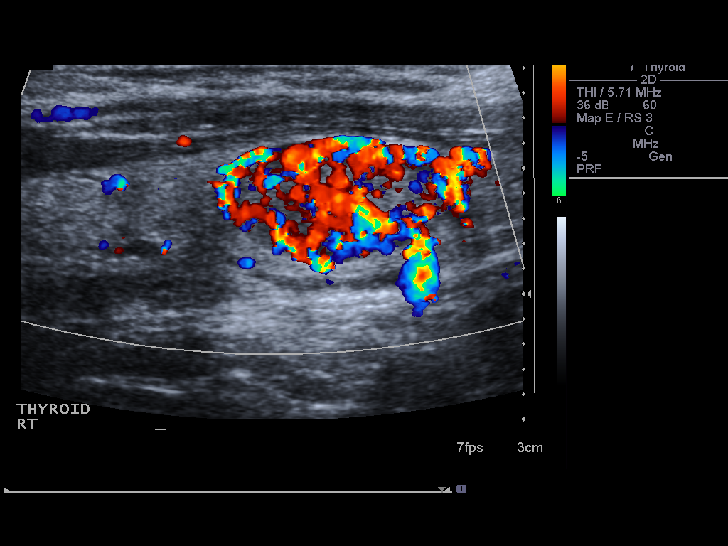
[im 6/13]
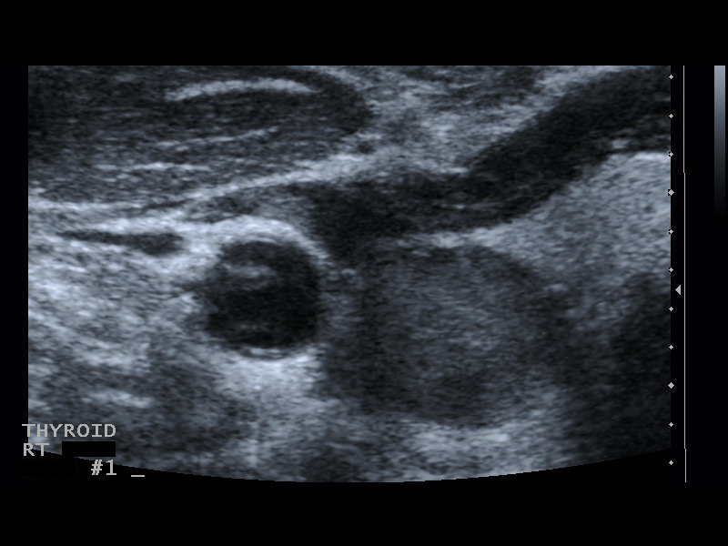
[im 7/13]
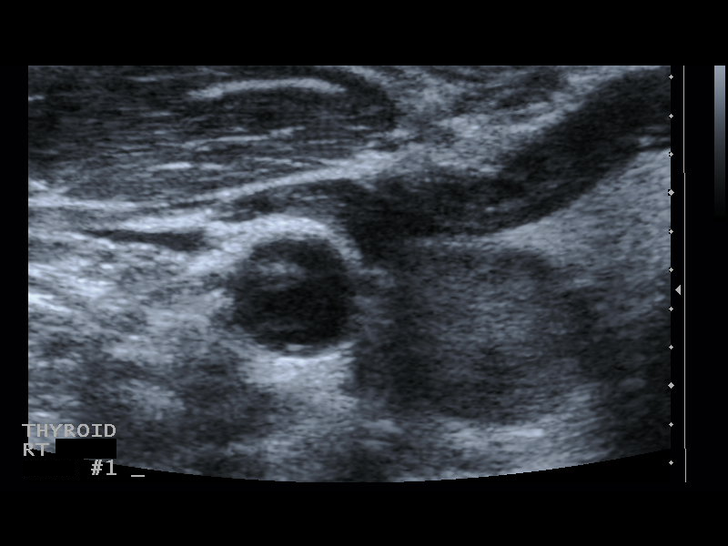
[im 8/13]
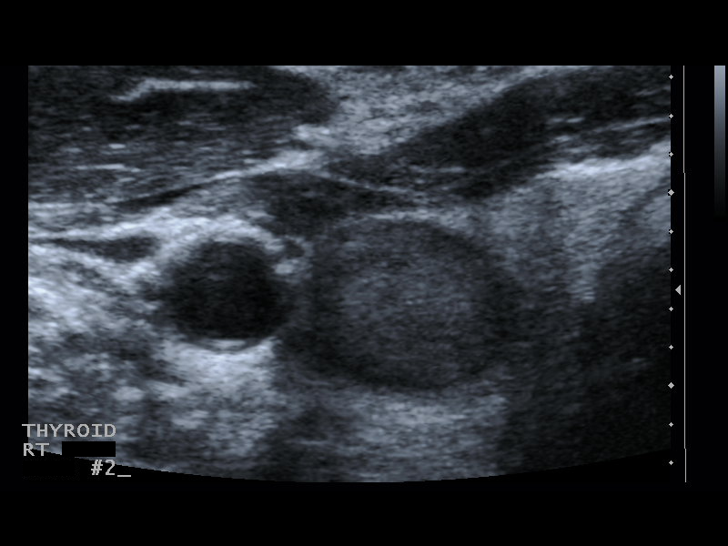
[im 9/13]
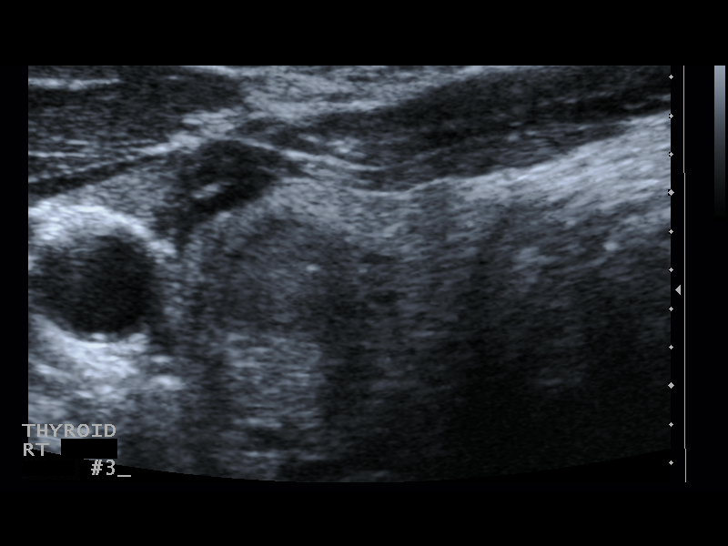
[im 10/13]
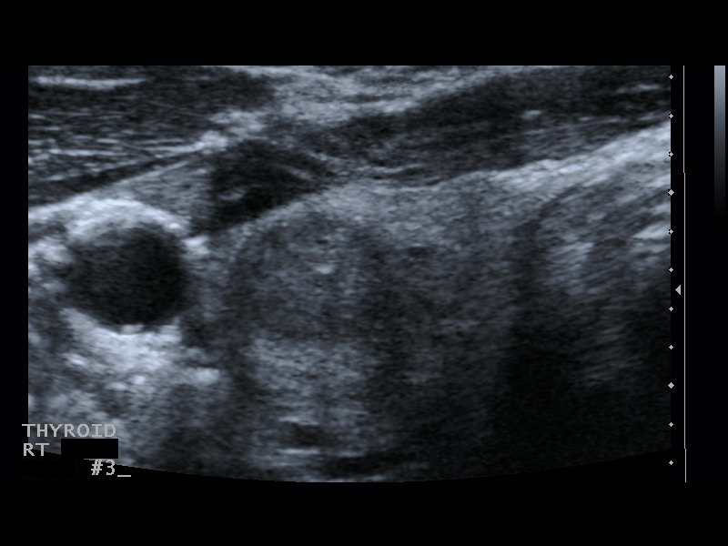
[im 11/13]
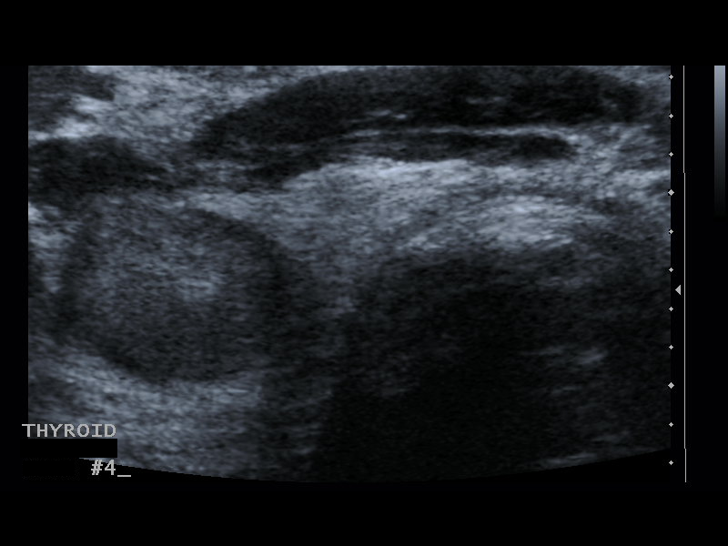
[im 12/13]
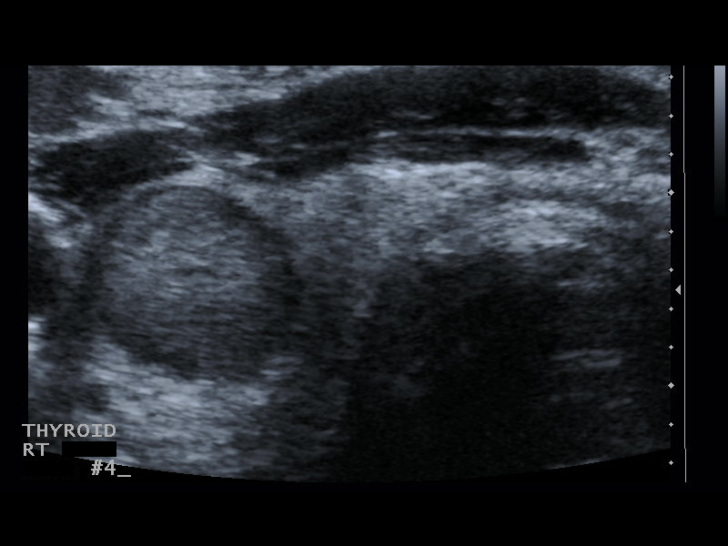
[im 13/13]
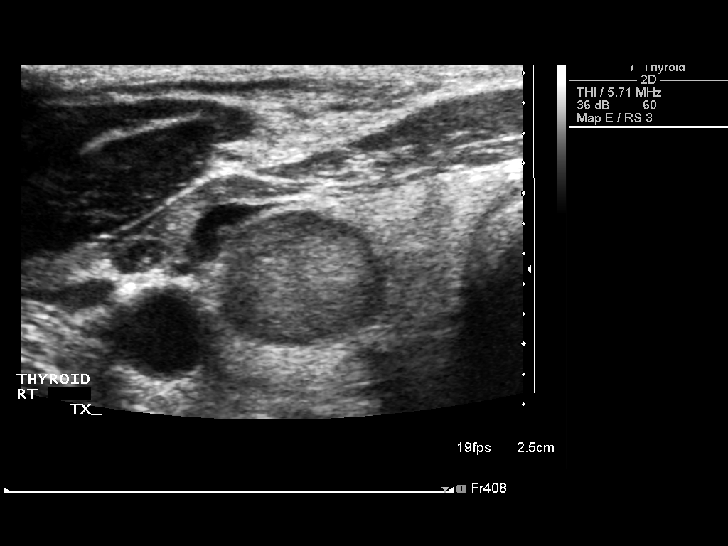

[13 of 13 positions shown; findings below may reference images not displayed]

Pre-procedural ultrasound scanning demonstrated dominant right sided
thyroid nodule measuring 1.3 x 1.0 x 2.0 cm

The procedure was planned. The neck was prepped in the usual sterile
fashion, and a sterile drape was applied covering the operative
field. A timeout was performed prior to the initiation of the
procedure. Local anesthesia was provided with 1% lidocaine.

Under direct ultrasound guidance, 4 FNA biopsies were performed of
the dominant right sided thyroid nodule with a 27 gauge needle. The
samples were prepared and submitted to pathology.

Limited post procedural scanning was negative for hematoma or
additional complication. Dressings were placed. The patient
tolerated the above procedures procedure well without immediate
postprocedural complication.
IMPRESSION: Technically successful ultrasound guided fine needle aspiration of
dominant right sided thyroid nodule.

## 2017-01-21 ENCOUNTER — Ambulatory Visit (INDEPENDENT_AMBULATORY_CARE_PROVIDER_SITE_OTHER): Payer: Managed Care, Other (non HMO) | Admitting: Family Medicine

## 2017-01-21 ENCOUNTER — Encounter: Payer: Self-pay | Admitting: Family Medicine

## 2017-01-21 VITALS — BP 120/84 | HR 57 | Temp 97.7°F | Ht 69.0 in | Wt 162.6 lb

## 2017-01-21 DIAGNOSIS — Z23 Encounter for immunization: Secondary | ICD-10-CM

## 2017-01-21 DIAGNOSIS — Z0000 Encounter for general adult medical examination without abnormal findings: Secondary | ICD-10-CM

## 2017-01-21 LAB — CBC WITH DIFFERENTIAL/PLATELET
BASOS ABS: 0 10*3/uL (ref 0.0–0.1)
Basophils Relative: 0.8 % (ref 0.0–3.0)
EOS PCT: 1.4 % (ref 0.0–5.0)
Eosinophils Absolute: 0.1 10*3/uL (ref 0.0–0.7)
HCT: 47.7 % (ref 39.0–52.0)
HEMOGLOBIN: 16.2 g/dL (ref 13.0–17.0)
Lymphocytes Relative: 23.2 % (ref 12.0–46.0)
Lymphs Abs: 1.1 10*3/uL (ref 0.7–4.0)
MCHC: 34 g/dL (ref 30.0–36.0)
MCV: 93.3 fl (ref 78.0–100.0)
MONOS PCT: 11.8 % (ref 3.0–12.0)
Monocytes Absolute: 0.6 10*3/uL (ref 0.1–1.0)
NEUTROS PCT: 62.8 % (ref 43.0–77.0)
Neutro Abs: 3.1 10*3/uL (ref 1.4–7.7)
Platelets: 229 10*3/uL (ref 150.0–400.0)
RBC: 5.11 Mil/uL (ref 4.22–5.81)
RDW: 12.8 % (ref 11.5–15.5)
WBC: 4.9 10*3/uL (ref 4.0–10.5)

## 2017-01-21 LAB — LIPID PANEL
CHOL/HDL RATIO: 4
Cholesterol: 208 mg/dL — ABNORMAL HIGH (ref 0–200)
HDL: 49.8 mg/dL (ref >39.00)
LDL Cholesterol: 136 mg/dL — ABNORMAL HIGH (ref 0–99)
NONHDL: 158.23
Triglycerides: 110 mg/dL (ref 0.0–149.0)
VLDL: 22 mg/dL (ref 0.0–40.0)

## 2017-01-21 LAB — HEPATIC FUNCTION PANEL
ALBUMIN: 4.5 g/dL (ref 3.5–5.2)
ALT: 17 U/L (ref 0–53)
AST: 17 U/L (ref 0–37)
Alkaline Phosphatase: 53 U/L (ref 39–117)
Bilirubin, Direct: 0.1 mg/dL (ref 0.0–0.3)
TOTAL PROTEIN: 6.8 g/dL (ref 6.0–8.3)
Total Bilirubin: 0.6 mg/dL (ref 0.2–1.2)

## 2017-01-21 LAB — BASIC METABOLIC PANEL
BUN: 10 mg/dL (ref 6–23)
CALCIUM: 9.9 mg/dL (ref 8.4–10.5)
CO2: 31 mEq/L (ref 19–32)
CREATININE: 0.82 mg/dL (ref 0.40–1.50)
Chloride: 102 mEq/L (ref 96–112)
GFR: 106.52 mL/min (ref >60.00)
Glucose, Bld: 100 mg/dL — ABNORMAL HIGH (ref 70–99)
Potassium: 3.9 mEq/L (ref 3.5–5.1)
Sodium: 137 mEq/L (ref 135–145)

## 2017-01-21 LAB — TSH: TSH: 1.13 u[IU]/mL (ref 0.35–4.50)

## 2017-01-21 NOTE — Progress Notes (Signed)
Subjective:     Patient ID: Warren Barrera, male   DOB: [birth year], 46 y.o.   MRN: [id number]  HPI Patient seen for physical exam. He's had some progressive left knee difficulties during the past year and apparently has some osteoarthritis. [facility] and did improve some with treatment (injections) there. Has pretty much had to stop running. Currently uses elliptical. Takes no regular medications. Needs tetanus booster.  He is married. He has 2 children. Teaches at [employer]. His wife is a Education officer, communitydentist. Never smoked. No regular alcohol.  Past Medical History:  Diagnosis Date  . Acute sinusitis, unspecified 04/20/2009  . Allergy    Past Surgical History:  Procedure Laterality Date  . KNEE ARTHROSCOPY W/ MENISCAL REPAIR  1997    reports that he has never smoked. He has never used smokeless tobacco. He reports that he does not drink alcohol or use drugs. family history includes Cancer in his unknown relative; Heart attack in his paternal grandfather and paternal grandmother; Heart disease (age of onset: 8346) in his father; Hyperlipidemia in his mother; Hypertension in his unknown relative. Allergies  Allergen Reactions  . Oxycodone-Acetaminophen     REACTION: rash from Percocet      Review of Systems  Constitutional: Negative for activity change, appetite change, fatigue and fever.  HENT: Negative for congestion, ear pain and trouble swallowing.   Eyes: Negative for pain and visual disturbance.  Respiratory: Negative for cough, shortness of breath and wheezing.   Cardiovascular: Negative for chest pain and palpitations.  Gastrointestinal: Negative for abdominal distention, abdominal pain, blood in stool, constipation, diarrhea, nausea, rectal pain and vomiting.  Genitourinary: Negative for dysuria, hematuria and testicular pain.  Musculoskeletal: Positive for arthralgias (Left knee pain as per history of present illness). Negative for joint swelling.  Skin:  Negative for rash.  Neurological: Negative for dizziness, syncope and headaches.  Hematological: Negative for adenopathy.  Psychiatric/Behavioral: Negative for confusion and dysphoric mood.       Objective:   Physical Exam  Constitutional: He is oriented to person, place, and time. He appears well-developed and well-nourished. No distress.  HENT:  Head: Normocephalic and atraumatic.  Right Ear: External ear normal.  Left Ear: External ear normal.  Mouth/Throat: Oropharynx is clear and moist.  Eyes: Pupils are equal, round, and reactive to light. Conjunctivae and EOM are normal.  Neck: Normal range of motion. Neck supple. No thyromegaly present.  Cardiovascular: Normal rate, regular rhythm and normal heart sounds.   No murmur heard. Pulmonary/Chest: No respiratory distress. He has no wheezes. He has no rales.  Abdominal: Soft. Bowel sounds are normal. He exhibits no distension and no mass. There is no tenderness. There is no rebound and no guarding.  Musculoskeletal: He exhibits no edema.  Lymphadenopathy:    He has no cervical adenopathy.  Neurological: He is alert and oriented to person, place, and time. He displays normal reflexes. No cranial nerve deficit.  Skin: No rash noted.  Psychiatric: He has a normal mood and affect.       Assessment:     Physical exam. Patient needs tetanus booster. Generally very healthy with no chronic or acute medical problems    Plan:     -Obtain screening lab work -Continue regular exercise habits -Follow-up in one year for physical and sooner as needed -Tetanus booster given  Kristian CoveyBruce W Jebediah Macrae MD [facility]

## 2017-01-22 ENCOUNTER — Encounter: Payer: Self-pay | Admitting: Family Medicine

## 2017-03-12 ENCOUNTER — Encounter: Payer: Self-pay | Admitting: Family Medicine

## 2017-09-08 ENCOUNTER — Ambulatory Visit: Payer: Managed Care, Other (non HMO) | Admitting: Family Medicine

## 2017-09-08 ENCOUNTER — Encounter: Payer: Self-pay | Admitting: Family Medicine

## 2017-09-08 VITALS — BP 120/84 | HR 72 | Temp 97.7°F | Wt 174.8 lb

## 2017-09-08 DIAGNOSIS — J310 Chronic rhinitis: Secondary | ICD-10-CM | POA: Diagnosis not present

## 2017-09-08 DIAGNOSIS — H6123 Impacted cerumen, bilateral: Secondary | ICD-10-CM

## 2017-09-08 NOTE — Progress Notes (Signed)
Subjective:     Patient ID: Warren Barrera, male   DOB: 04-Sep-1970, 47 y.o.   MRN: [id number]  HPI Patient seen with rhinitis symptoms which occur fairly consistently after eating. He does have some spring allergies but has not had any pollen-related issues us for. He has noted issues to clear the summer. Symptoms are usually fairly transient. He's never had any hives or other allergic phenomena. He's also complains of bilateral ear fullness past several months. No drainage. No ear pain. No dizziness.  He has not tried any medications for his rhinitis symptoms.  Past Medical History:  Diagnosis Date  . Acute sinusitis, unspecified 04/20/2009  . Allergy    Past Surgical History:  Procedure Laterality Date  . KNEE ARTHROSCOPY W/ MENISCAL REPAIR  1997    reports that  has never smoked. he has never used smokeless tobacco. He reports that he does not drink alcohol or use drugs. family history includes Cancer in his unknown relative; Heart attack in his paternal grandfather and paternal grandmother; Heart disease (age of onset: 5146) in his father; Hyperlipidemia in his mother; Hypertension in his unknown relative. Allergies  Allergen Reactions  . Oxycodone-Acetaminophen     REACTION: rash from Percocet     Review of Systems  HENT: Positive for congestion and rhinorrhea. Negative for ear discharge, ear pain, hearing loss, nosebleeds, sinus pressure, sinus pain and sore throat.        Objective:   Physical Exam  Constitutional: He appears well-developed and well-nourished.  HENT:  Mouth/Throat: Oropharynx is clear and moist.  Cerumen impaction bilaterally  Neck: Neck supple.  Cardiovascular: Normal rate and regular rhythm.  Pulmonary/Chest: Effort normal and breath sounds normal. No respiratory distress. He has no wheezes. He has no rales.  Lymphadenopathy:    He has no cervical adenopathy.       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

## 2017-09-08 NOTE — Patient Instructions (Signed)
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  This would especially be expected with heated or spicy foods.  Let me know if the Flonase or Nasacort aren't effective.  We could also look at possible topical anti-histamine if needed.

## 2017-12-16 ENCOUNTER — Encounter (INDEPENDENT_AMBULATORY_CARE_PROVIDER_SITE_OTHER): Payer: Self-pay | Admitting: Orthopedic Surgery

## 2017-12-16 ENCOUNTER — Ambulatory Visit (INDEPENDENT_AMBULATORY_CARE_PROVIDER_SITE_OTHER): Payer: Managed Care, Other (non HMO) | Admitting: Orthopedic Surgery

## 2017-12-16 ENCOUNTER — Telehealth (INDEPENDENT_AMBULATORY_CARE_PROVIDER_SITE_OTHER): Payer: Self-pay

## 2017-12-16 ENCOUNTER — Ambulatory Visit (INDEPENDENT_AMBULATORY_CARE_PROVIDER_SITE_OTHER): Payer: Self-pay

## 2017-12-16 DIAGNOSIS — M25562 Pain in left knee: Secondary | ICD-10-CM | POA: Diagnosis not present

## 2017-12-16 DIAGNOSIS — M1712 Unilateral primary osteoarthritis, left knee: Secondary | ICD-10-CM

## 2017-12-16 DIAGNOSIS — M1711 Unilateral primary osteoarthritis, right knee: Secondary | ICD-10-CM

## 2017-12-16 DIAGNOSIS — M25561 Pain in right knee: Secondary | ICD-10-CM | POA: Diagnosis not present

## 2017-12-16 NOTE — Progress Notes (Signed)
Office Visit Note   Patient: Warren Barrera           Date of Birth: [birth year]           MRN: [id number] Visit Date: 12/16/2017 Requested by: Warren Covey, MD [address] [place], [place] [address] PCP: Warren Covey, MD  Subjective: Chief Complaint  Patient presents with  . Knee Pain    HPI: Warren Barrera is a patient with pain left worse than right.  Patient is a runner.  He has had an MRI scan which was done last year.  That report is reviewed.  It shows predominantly patellofemoral arthritis with nothing definitively going on with the menisci or ligaments.  He had an injection of Orthovisc which helped.  [facility].  Multiple notes from that clinic are reviewed.  This includes predominantly ultrasound photos of various parts of the knee.  He was running 2 months ago about 5 times a week but lately he has had to decrease his running.  Typically he runs 1 to 2 miles at a time.  He does have a history of left knee arthroscopy and medial reefing for patellar instability done in 1997.              ROS: All systems reviewed are negative as they relate to the chief complaint within the history of present illness.  Patient denies  fevers or chills.   Assessment & Plan: Visit Diagnoses:  1. Pain in both knees, unspecified chronicity   2. Unilateral primary osteoarthritis, left knee   3. Unilateral primary osteoarthritis, right knee     Plan: Impression is bilateral knee patellofemoral arthritis with mild effusion in the less symptomatic right knee and no effusion in the more symptomatic left knee.  I think he had an excellent result with Orthovisc last year.  Based on his examination and MRI scan of the left knee I see no clear indication for arthroscopic intervention at this time.  He has predominantly patellofemoral arthritis which is symptomatic.  I think repeat Orthovisc injections are indicated.  I will see him back in 3 weeks once we get that  approved through his insurance  Follow-Up Instructions: No follow-ups on file.   Orders:  Orders Placed This Encounter  Procedures  . XR Knee 1-2 Views Right  . XR KNEE 3 VIEW LEFT   No orders of the defined types were placed in this encounter.     Procedures: No procedures performed   Clinical Data: No additional findings.  Objective: Vital Signs: There were no vitals taken for this visit.  Physical Exam:   Constitutional: Patient appears well-developed HEENT:  Head: Normocephalic Eyes:EOM are normal Neck: Normal range of motion Cardiovascular: Normal rate Pulmonary/chest: Effort normal Neurologic: Patient is alert Skin: Skin is warm Psychiatric: Patient has normal mood and affect    Ortho Exam: Orthopedic exam demonstrates normal gait alignment and normal body mass index.  He has mild effusion in the right knee but no effusion in the left.  He does have full range of motion lacking only about 5 degrees of full flexion on the left compared to the right.  Pedal pulses palpable.  There is no groin pain with internal/external rotation of the leg.  No focal joint line tenderness is present.  No tenderness over the extensor mechanism.  No other masses lymphadenopathy or skin changes noted in that right or left knee region.  Well-healed surgical incision on the medial  aspect of the patella is present on the left knee  Specialty Comments:  No specialty comments available.  Imaging: Xr Knee 1-2 Views Right  Result Date: 12/16/2017 AP lateral merchant right knee reviewed.  Medial and lateral joint spaces are maintained.  There is no fracture or dislocation.  Alignment normal.  Moderate patellofemoral degenerative changes present with no evidence of malalignment  Xr Knee 3 View Left  Result Date: 12/16/2017 AP lateral merchant left knee reviewed.  Medial and lateral joint spaces are maintained.  No fracture or dislocation is present.  Alignment normal.  Moderate  patellofemoral degenerative changes present with no evidence of patellar malalignment    PMFS History: Patient Active Problem List   Diagnosis Date Noted  . Hypercholesteremia 01/12/2012  . Reactive airway disease 06/03/2011  . ACUTE SINUSITIS, UNSPECIFIED 04/20/2009   Past Medical History:  Diagnosis Date  . Acute sinusitis, unspecified 04/20/2009  . Allergy     Family History  Problem Relation Age of Onset  . Heart disease Father 6146       angioplasty  . Cancer Unknown        prostate/fhx  . Hypertension Unknown        parent, grandparent  . Hyperlipidemia Mother   . Heart attack Paternal Grandmother   . Heart attack Paternal Grandfather     Past Surgical History:  Procedure Laterality Date  . KNEE ARTHROSCOPY W/ MENISCAL REPAIR  1997   Social History   Occupational History  . Not on file  Tobacco Use  . Smoking status: Never Smoker  . Smokeless tobacco: Never Used  Substance and Sexual Activity  . Alcohol use: No  . Drug use: No  . Sexual activity: Not on file

## 2017-12-16 NOTE — Telephone Encounter (Signed)
Can we please get patient approved for bilateral orthovisc injections He will be coming back in for ROV for these injections 3 weeks from today.

## 2017-12-23 ENCOUNTER — Telehealth (INDEPENDENT_AMBULATORY_CARE_PROVIDER_SITE_OTHER): Payer: Self-pay

## 2017-12-23 NOTE — Telephone Encounter (Signed)
Submitted application online for Orthovisc injection series, bilateral knee. 

## 2017-12-23 NOTE — Telephone Encounter (Signed)
Noted  

## 2017-12-30 ENCOUNTER — Telehealth (INDEPENDENT_AMBULATORY_CARE_PROVIDER_SITE_OTHER): Payer: Self-pay

## 2017-12-30 NOTE — Telephone Encounter (Signed)
Talked with Warren Barrera. [facility] and [facility].  PA form is being faxed 12/30/2017 per Warren Barrera.

## 2018-01-04 ENCOUNTER — Telehealth (INDEPENDENT_AMBULATORY_CARE_PROVIDER_SITE_OTHER): Payer: Self-pay

## 2018-01-04 NOTE — Telephone Encounter (Signed)
PA required for Orthovisc series, bilateral knee. Faxed completed PA form to [employer] at [phone].

## 2018-01-05 ENCOUNTER — Telehealth (INDEPENDENT_AMBULATORY_CARE_PROVIDER_SITE_OTHER): Payer: Self-pay

## 2018-01-05 NOTE — Telephone Encounter (Signed)
PA Approved for Orthovisc series, bilateral knee. Authorization#[id number] Valid 01/05/2018- 03/02/2018.  Talked with patient and advised him that he has been approved for Orthovisc injection, bilateral knee. Co-pay of $50.00 each visit After deductible has been met, patient will be responsible for 20% of the allowed amount.  Appt.scheduled 01/08/2018.

## 2018-01-07 ENCOUNTER — Telehealth (INDEPENDENT_AMBULATORY_CARE_PROVIDER_SITE_OTHER): Payer: Self-pay

## 2018-01-07 NOTE — Telephone Encounter (Signed)
Talked with patient concerning Orthovisc gel injection.  Patient has decided to cancel appointment for gel injections due to the OOP being so expensive.  Patient would like for me to apply for Synvisc series to see if the would be about the same or less for gel injection.  Will submit.

## 2018-01-08 ENCOUNTER — Ambulatory Visit (INDEPENDENT_AMBULATORY_CARE_PROVIDER_SITE_OTHER): Payer: Managed Care, Other (non HMO) | Admitting: Orthopedic Surgery

## 2018-01-15 ENCOUNTER — Ambulatory Visit (INDEPENDENT_AMBULATORY_CARE_PROVIDER_SITE_OTHER): Payer: Managed Care, Other (non HMO) | Admitting: Family Medicine

## 2018-01-15 ENCOUNTER — Encounter: Payer: Self-pay | Admitting: Family Medicine

## 2018-01-15 VITALS — BP 120/82 | HR 60 | Temp 97.8°F | Ht 69.0 in | Wt 173.5 lb

## 2018-01-15 DIAGNOSIS — Z0000 Encounter for general adult medical examination without abnormal findings: Secondary | ICD-10-CM | POA: Diagnosis not present

## 2018-01-15 LAB — CBC WITH DIFFERENTIAL/PLATELET
BASOS PCT: 0.6 % (ref 0.0–3.0)
Basophils Absolute: 0 10*3/uL (ref 0.0–0.1)
EOS PCT: 2.9 % (ref 0.0–5.0)
Eosinophils Absolute: 0.2 10*3/uL (ref 0.0–0.7)
HCT: 45.8 % (ref 39.0–52.0)
Hemoglobin: 15.7 g/dL (ref 13.0–17.0)
Lymphocytes Relative: 27.7 % (ref 12.0–46.0)
Lymphs Abs: 1.7 10*3/uL (ref 0.7–4.0)
MCHC: 34.3 g/dL (ref 30.0–36.0)
MCV: 91.8 fl (ref 78.0–100.0)
MONO ABS: 0.6 10*3/uL (ref 0.1–1.0)
MONOS PCT: 10.5 % (ref 3.0–12.0)
NEUTROS ABS: 3.6 10*3/uL (ref 1.4–7.7)
NEUTROS PCT: 58.3 % (ref 43.0–77.0)
Platelets: 239 10*3/uL (ref 150.0–400.0)
RBC: 4.99 Mil/uL (ref 4.22–5.81)
RDW: 13.1 % (ref 11.5–15.5)
WBC: 6.1 10*3/uL (ref 4.0–10.5)

## 2018-01-15 LAB — LIPID PANEL
Cholesterol: 202 mg/dL — ABNORMAL HIGH (ref 0–200)
HDL: 48.6 mg/dL (ref >39.00)
LDL Cholesterol: 131 mg/dL — ABNORMAL HIGH (ref 0–99)
NONHDL: 153.28
Total CHOL/HDL Ratio: 4
Triglycerides: 113 mg/dL (ref 0.0–149.0)
VLDL: 22.6 mg/dL (ref 0.0–40.0)

## 2018-01-15 LAB — BASIC METABOLIC PANEL
BUN: 13 mg/dL (ref 6–23)
CO2: 30 meq/L (ref 19–32)
Calcium: 9.9 mg/dL (ref 8.4–10.5)
Chloride: 101 mEq/L (ref 96–112)
Creatinine, Ser: 0.94 mg/dL (ref 0.40–1.50)
GFR: 90.62 mL/min (ref >60.00)
GLUCOSE: 86 mg/dL (ref 70–99)
Potassium: 3.8 mEq/L (ref 3.5–5.1)
SODIUM: 139 meq/L (ref 135–145)

## 2018-01-15 LAB — HEPATIC FUNCTION PANEL
ALBUMIN: 4.4 g/dL (ref 3.5–5.2)
ALT: 22 U/L (ref 0–53)
AST: 21 U/L (ref 0–37)
Alkaline Phosphatase: 55 U/L (ref 39–117)
Bilirubin, Direct: 0.1 mg/dL (ref 0.0–0.3)
TOTAL PROTEIN: 7 g/dL (ref 6.0–8.3)
Total Bilirubin: 0.8 mg/dL (ref 0.2–1.2)

## 2018-01-15 LAB — TSH: TSH: 1.26 u[IU]/mL (ref 0.35–4.50)

## 2018-01-15 NOTE — Progress Notes (Signed)
  Subjective:     Patient ID: Warren Barrera, male   DOB: [birth year], 47 y.o.   MRN: [id number]  HPI Patient is seen for physical exam. Generally very healthy. He's had some ongoing issues with knees with arthritis and is followed by orthopedics and undergoing injections for Visco supplementation. Long history of running and he would like to resume running eventually. Takes no medications.  Tetanus up-to-date. Will turned 50 next year. Father had coronary disease age 49. Patient had CT cardiac scoring 2015 with no calcium detected.  Past Medical History:  Diagnosis Date  . Acute sinusitis, unspecified 04/20/2009  . Allergy    Past Surgical History:  Procedure Laterality Date  . KNEE ARTHROSCOPY W/ MENISCAL REPAIR  1997    reports that he has never smoked. He has never used smokeless tobacco. He reports that he does not drink alcohol or use drugs. family history includes Cancer in his unknown relative; Heart attack in his paternal grandfather and paternal grandmother; Heart disease (age of onset: 3446) in his father; Hyperlipidemia in his father; Hypertension in his unknown relative. Allergies  Allergen Reactions  . Oxycodone-Acetaminophen     REACTION: rash from Percocet     Review of Systems  Constitutional: Negative for activity change, appetite change, fatigue and fever.  HENT: Negative for congestion, ear pain and trouble swallowing.   Eyes: Negative for pain and visual disturbance.  Respiratory: Negative for cough, shortness of breath and wheezing.   Cardiovascular: Negative for chest pain and palpitations.  Gastrointestinal: Negative for abdominal distention, abdominal pain, blood in stool, constipation, diarrhea, nausea, rectal pain and vomiting.  Genitourinary: Negative for dysuria, hematuria and testicular pain.  Musculoskeletal: Negative for joint swelling.  Skin: Negative for rash.  Neurological: Negative for dizziness, syncope and headaches.  Hematological: Negative for  adenopathy.  Psychiatric/Behavioral: Negative for confusion and dysphoric mood.       Objective:   Physical Exam  Constitutional: He is oriented to person, place, and time. He appears well-developed and well-nourished. No distress.  HENT:  Head: Normocephalic and atraumatic.  Right Ear: External ear normal.  Left Ear: External ear normal.  Mouth/Throat: Oropharynx is clear and moist.  Eyes: Pupils are equal, round, and reactive to light. Conjunctivae and EOM are normal.  Neck: Normal range of motion. Neck supple. No thyromegaly present.  Cardiovascular: Normal rate, regular rhythm and normal heart sounds.  No murmur heard. Pulmonary/Chest: No respiratory distress. He has no wheezes. He has no rales.  Abdominal: Soft. Bowel sounds are normal. He exhibits no distension and no mass. There is no tenderness. There is no rebound and no guarding.  Musculoskeletal: He exhibits no edema.  Lymphadenopathy:    He has no cervical adenopathy.  Neurological: He is alert and oriented to person, place, and time. He displays normal reflexes. No cranial nerve deficit.  Skin: No rash noted.  Psychiatric: He has a normal mood and affect.       Assessment:     Physical exam. Generally healthy 47 year old male. He's had some modest weight gain since having to give up running during the past year    Plan:     -Obtain screening lab work -Screening colonoscopy by next year -Consider shingles vaccine next year  Warren CoveyBruce W Hermie Reagor MD [facility]

## 2018-01-17 ENCOUNTER — Encounter: Payer: Self-pay | Admitting: Family Medicine

## 2018-01-21 ENCOUNTER — Telehealth (INDEPENDENT_AMBULATORY_CARE_PROVIDER_SITE_OTHER): Payer: Self-pay | Admitting: Orthopedic Surgery

## 2018-01-21 ENCOUNTER — Ambulatory Visit (INDEPENDENT_AMBULATORY_CARE_PROVIDER_SITE_OTHER): Payer: Managed Care, Other (non HMO) | Admitting: Orthopedic Surgery

## 2018-01-21 ENCOUNTER — Encounter (INDEPENDENT_AMBULATORY_CARE_PROVIDER_SITE_OTHER): Payer: Self-pay | Admitting: Orthopedic Surgery

## 2018-01-21 DIAGNOSIS — M1711 Unilateral primary osteoarthritis, right knee: Secondary | ICD-10-CM | POA: Diagnosis not present

## 2018-01-21 DIAGNOSIS — M1712 Unilateral primary osteoarthritis, left knee: Secondary | ICD-10-CM

## 2018-01-21 MED ORDER — HYALURONAN 30 MG/2ML IX SOSY
30.0000 mg | PREFILLED_SYRINGE | INTRA_ARTICULAR | Status: AC | PRN
  Administered 2018-01-21: 30 mg via INTRA_ARTICULAR

## 2018-01-21 MED ORDER — LIDOCAINE HCL 1 % IJ SOLN
5.0000 mL | INTRAMUSCULAR | Status: AC | PRN
  Administered 2018-01-21: 5 mL

## 2018-01-21 NOTE — Telephone Encounter (Signed)
Patient called asked if he can go ahead and schedule an appointment with [facility] next week for an injection in both knees. The number to contact patient is [phone]

## 2018-01-21 NOTE — Telephone Encounter (Signed)
IC s/w patient. appts changed.

## 2018-01-21 NOTE — Progress Notes (Signed)
   Procedure Note  Patient: Warren Barrera J Kipper             Date of Birth: 1970/11/20           MRN: [id number]             Visit Date: 01/21/2018  Procedures: Visit Diagnoses: Unilateral primary osteoarthritis, left knee  Unilateral primary osteoarthritis, right knee  Large Joint Inj: bilateral knee on 01/21/2018 9:22 AM Indications: diagnostic evaluation, joint swelling and pain Details: 18 G 1.5 in needle, superolateral approach  Arthrogram: No  Medications (Right): 5 mL lidocaine 1 %; 30 mg Hyaluronan 30 MG/2ML Medications (Left): 5 mL lidocaine 1 %; 30 mg Hyaluronan 30 MG/2ML Outcome: tolerated well, no immediate complications Procedure, treatment alternatives, risks and benefits explained, specific risks discussed. Consent was given by the patient. Immediately prior to procedure a time out was called to verify the correct patient, procedure, equipment, support staff and site/side marked as required. Patient was prepped and draped in the usual sterile fashion.

## 2018-01-28 ENCOUNTER — Ambulatory Visit (INDEPENDENT_AMBULATORY_CARE_PROVIDER_SITE_OTHER): Payer: Managed Care, Other (non HMO) | Admitting: Physician Assistant

## 2018-02-03 ENCOUNTER — Ambulatory Visit (INDEPENDENT_AMBULATORY_CARE_PROVIDER_SITE_OTHER): Payer: Managed Care, Other (non HMO) | Admitting: Orthopedic Surgery

## 2018-02-03 ENCOUNTER — Encounter (INDEPENDENT_AMBULATORY_CARE_PROVIDER_SITE_OTHER): Payer: Self-pay | Admitting: Orthopedic Surgery

## 2018-02-03 DIAGNOSIS — M1712 Unilateral primary osteoarthritis, left knee: Secondary | ICD-10-CM

## 2018-02-03 DIAGNOSIS — M1711 Unilateral primary osteoarthritis, right knee: Secondary | ICD-10-CM

## 2018-02-05 ENCOUNTER — Ambulatory Visit (INDEPENDENT_AMBULATORY_CARE_PROVIDER_SITE_OTHER): Payer: Managed Care, Other (non HMO) | Admitting: Orthopedic Surgery

## 2018-02-07 ENCOUNTER — Encounter (INDEPENDENT_AMBULATORY_CARE_PROVIDER_SITE_OTHER): Payer: Self-pay | Admitting: Orthopedic Surgery

## 2018-02-07 DIAGNOSIS — M1712 Unilateral primary osteoarthritis, left knee: Secondary | ICD-10-CM | POA: Diagnosis not present

## 2018-02-07 DIAGNOSIS — M1711 Unilateral primary osteoarthritis, right knee: Secondary | ICD-10-CM | POA: Diagnosis not present

## 2018-02-07 MED ORDER — HYALURONAN 30 MG/2ML IX SOSY
30.0000 mg | PREFILLED_SYRINGE | INTRA_ARTICULAR | Status: AC | PRN
  Administered 2018-02-07: 30 mg via INTRA_ARTICULAR

## 2018-02-07 MED ORDER — LIDOCAINE HCL 1 % IJ SOLN
5.0000 mL | INTRAMUSCULAR | Status: AC | PRN
  Administered 2018-02-07: 5 mL

## 2018-02-07 NOTE — Progress Notes (Signed)
   Procedure Note  Patient: Warren Barrera             Date of Birth: [birth year]           MRN: [id number]             Visit Date: 02/03/2018  Procedures: Visit Diagnoses: Unilateral primary osteoarthritis, left knee  Unilateral primary osteoarthritis, right knee  Large Joint Inj: bilateral knee on 02/07/2018 9:53 AM Indications: diagnostic evaluation, joint swelling and pain Details: 18 G 1.5 in needle, superolateral approach  Arthrogram: No  Medications (Right): 5 mL lidocaine 1 %; 30 mg Hyaluronan 30 MG/2ML Medications (Left): 5 mL lidocaine 1 %; 30 mg Hyaluronan 30 MG/2ML Outcome: tolerated well, no immediate complications Procedure, treatment alternatives, risks and benefits explained, specific risks discussed. Consent was given by the patient. Immediately prior to procedure a time out was called to verify the correct patient, procedure, equipment, support staff and site/side marked as required. Patient was prepped and draped in the usual sterile fashion.

## 2018-02-10 ENCOUNTER — Encounter (INDEPENDENT_AMBULATORY_CARE_PROVIDER_SITE_OTHER): Payer: Self-pay | Admitting: Orthopedic Surgery

## 2018-02-10 ENCOUNTER — Ambulatory Visit (INDEPENDENT_AMBULATORY_CARE_PROVIDER_SITE_OTHER): Payer: Managed Care, Other (non HMO) | Admitting: Orthopedic Surgery

## 2018-02-10 DIAGNOSIS — M1711 Unilateral primary osteoarthritis, right knee: Secondary | ICD-10-CM | POA: Diagnosis not present

## 2018-02-10 DIAGNOSIS — M1712 Unilateral primary osteoarthritis, left knee: Secondary | ICD-10-CM | POA: Diagnosis not present

## 2018-02-10 MED ORDER — LIDOCAINE HCL 1 % IJ SOLN
5.0000 mL | INTRAMUSCULAR | Status: AC | PRN
  Administered 2018-02-10: 5 mL

## 2018-02-10 MED ORDER — SODIUM HYALURONATE (VISCOSUP) 20 MG/2ML IX SOSY
20.0000 mg | PREFILLED_SYRINGE | INTRA_ARTICULAR | Status: AC | PRN
  Administered 2018-02-10: 20 mg via INTRA_ARTICULAR

## 2018-02-10 NOTE — Progress Notes (Signed)
   Procedure Note  Patient: Warren Barrera             Date of Birth: 04/05/1971           MRN: [id number]             Visit Date: 02/10/2018  Procedures: Visit Diagnoses: Unilateral primary osteoarthritis, left knee  Unilateral primary osteoarthritis, right knee  Large Joint Inj: bilateral knee on 02/10/2018 12:18 PM Indications: pain, joint swelling and diagnostic evaluation Details: 18 G 1.5 in needle, superolateral approach  Arthrogram: No  Medications (Right): 5 mL lidocaine 1 %; 20 mg Sodium Hyaluronate 20 MG/2ML Medications (Left): 5 mL lidocaine 1 %; 20 mg Sodium Hyaluronate 20 MG/2ML Aspirate (Left): 15 mL yellow Outcome: tolerated well, no immediate complications Procedure, treatment alternatives, risks and benefits explained, specific risks discussed. Consent was given by the patient. Immediately prior to procedure a time out was called to verify the correct patient, procedure, equipment, support staff and site/side marked as required. Patient was prepped and draped in the usual sterile fashion.     Patient is having continued pain in the left knee.  MRI scan over a year ago.  If we are considering operative intervention for this 47 year old patient then we will need to repeat the scan prior to decision for or against arthroscopic or knee replacement type surgery

## 2018-04-09 ENCOUNTER — Encounter (INDEPENDENT_AMBULATORY_CARE_PROVIDER_SITE_OTHER): Payer: Self-pay | Admitting: Family Medicine

## 2018-04-09 ENCOUNTER — Ambulatory Visit (INDEPENDENT_AMBULATORY_CARE_PROVIDER_SITE_OTHER): Payer: Managed Care, Other (non HMO) | Admitting: Family Medicine

## 2018-04-09 DIAGNOSIS — M1712 Unilateral primary osteoarthritis, left knee: Secondary | ICD-10-CM

## 2018-04-09 NOTE — Progress Notes (Signed)
   Office Visit Note   Patient: Warren Barrera           Date of Birth: 1970/07/19           MRN: [id number] Visit Date: 04/09/2018 Requested by: Kristian Covey, MD [address] [place], [place] [address] PCP: Kristian Covey, MD  Subjective: Chief Complaint  Patient presents with  . Left Knee - Pain    Pain and swelling in knee again, after running a couple laps on a track in September.  S/p series of Orthovisc injections in both knees (Dr. August Saucer).    HPI: He is here with recurrent left knee pain.  Hyaluronic acid injections gave him more than 12 months relief the first time he has been, but most recently they only helped for a few weeks.  His knee remains swollen and achy, no mechanical symptoms.  He has patellofemoral arthritis on x-ray.              ROS: Noncontributory  Objective: Vital Signs: There were no vitals taken for this visit.  Physical Exam:  Left knee: 2+ effusion, no warmth or erythema.  Full active extension with flexion of about 110 degrees.  Slightly tender on the medial joint line, no palpable click with McMurray's.  Imaging: None today.  Assessment & Plan: 1.  Left knee pain with effusion, and underlying patellofemoral arthritis. -Discussed options with him and he wants to try aspirating the knee followed by knee brace such as patella strap, or PSO brace.  He will also try glucosamine and turmeric.   Follow-Up Instructions: No follow-ups on file.       Procedures: Left knee aspiration: After sterile prep with Betadine, injected 3 cc 1% lidocaine without epinephrine then aspirated 28 cc of clear yellow synovial fluid from superolateral approach.   PMFS History: Patient Active Problem List   Diagnosis Date Noted  . Hypercholesteremia 01/12/2012  . Reactive airway disease 06/03/2011  . ACUTE SINUSITIS, UNSPECIFIED 04/20/2009   Past Medical History:  Diagnosis Date  . Acute sinusitis, unspecified 04/20/2009  . Allergy     Family  History  Problem Relation Age of Onset  . Heart disease Father 39       angioplasty  . Hyperlipidemia Father   . Cancer Unknown        prostate/fhx  . Hypertension Unknown        parent, grandparent  . Heart attack Paternal Grandmother   . Heart attack Paternal Grandfather     Past Surgical History:  Procedure Laterality Date  . KNEE ARTHROSCOPY W/ MENISCAL REPAIR  1997   Social History   Occupational History  . Not on file  Tobacco Use  . Smoking status: Never Smoker  . Smokeless tobacco: Never Used  Substance and Sexual Activity  . Alcohol use: No  . Drug use: No  . Sexual activity: Not on file

## 2018-04-09 NOTE — Patient Instructions (Signed)
   Braces:    - Cho-Pat strap - PSO brace (patella stabilizing orthosis)   Other:  - Glucosamine Sulfate 1,000 mg twice daily - Turmeric 500 mg twice daily

## 2018-04-14 ENCOUNTER — Ambulatory Visit (INDEPENDENT_AMBULATORY_CARE_PROVIDER_SITE_OTHER): Payer: Managed Care, Other (non HMO) | Admitting: Orthopedic Surgery

## 2018-12-01 ENCOUNTER — Ambulatory Visit (INDEPENDENT_AMBULATORY_CARE_PROVIDER_SITE_OTHER): Payer: Managed Care, Other (non HMO) | Admitting: Family Medicine

## 2018-12-01 ENCOUNTER — Other Ambulatory Visit: Payer: Self-pay

## 2018-12-01 DIAGNOSIS — L237 Allergic contact dermatitis due to plants, except food: Secondary | ICD-10-CM | POA: Diagnosis not present

## 2018-12-01 MED ORDER — PREDNISONE 10 MG PO TABS: ORAL_TABLET | ORAL | 0 refills | Status: DC

## 2018-12-01 NOTE — Progress Notes (Signed)
Patient ID: Warren Barrera, male   DOB: [birth year], 48 y.o.   MRN: [id number]  This visit type was conducted due to national recommendations for restrictions regarding the COVID-19 pandemic in an effort to limit this patient's exposure and mitigate transmission in our community.   Virtual Visit via Video Note  I connected with Warren Barrera on 12/01/18 at  1:30 PM EDT by a video enabled telemedicine application and verified that I am speaking with the correct person using two identifiers.  Location patient: home Location provider:work or home office Persons participating in the virtual visit: patient, provider  I discussed the limitations of evaluation and management by telemedicine and the availability of in person appointments. The patient expressed understanding and agreed to proceed.   HPI:  Patient has skin rash mostly left forearm but also some left thigh.  Pruritic.  Onset a few days ago.  He has been some time recently in the woods with his son.  He has had contact dermatitis in the past and this rash is very similar.  No fevers or chills.  Has required prednisone therapy in the past.  Requesting the same.   ROS: See pertinent positives and negatives per HPI.  Past Medical History:  Diagnosis Date  . Acute sinusitis, unspecified 04/20/2009  . Allergy     Past Surgical History:  Procedure Laterality Date  . KNEE ARTHROSCOPY W/ MENISCAL REPAIR  1997    Family History  Problem Relation Age of Onset  . Heart disease Father 3       angioplasty  . Hyperlipidemia Father   . Cancer Unknown        prostate/fhx  . Hypertension Unknown        parent, grandparent  . Heart attack Paternal Grandmother   . Heart attack Paternal Grandfather     SOCIAL HX: Married with 2 children.  Non-smoker.  Teaches at [employer]   Current Outpatient Medications:  .  Ascorbic Acid (VITAMIN C) 100 MG tablet, Take 100 mg by mouth daily.  , Disp: , Rfl:  .  Multiple Vitamin  (MULTIVITAMIN) tablet, Take 1 tablet by mouth daily.  , Disp: , Rfl:  .  predniSONE (DELTASONE) 10 MG tablet, Taper as follows: 4-4-4-4-3-3-3-2-2-1-1, Disp: 31 tablet, Rfl: 0  EXAM:  VITALS per patient if applicable:  GENERAL: alert, oriented, appears well and in no acute distress  HEENT: atraumatic, conjunttiva clear, no obvious abnormalities on inspection of external nose and ears  NECK: normal movements of the head and neck  LUNGS: on inspection no signs of respiratory distress, breathing rate appears normal, no obvious gross SOB, gasping or wheezing  CV: no obvious cyanosis  MS: moves all visible extremities without noticeable abnormality  PSYCH/NEURO: pleasant and cooperative, no obvious depression or anxiety, speech and thought processing grossly intact  Skin: Patient has slightly raised vesicular rash with scattered patches on his left forearm and also thigh  ASSESSMENT AND PLAN:  Discussed the following assessment and plan:  Allergic contact dermatitis due to plants, except food  -Start prednisone taper.  He is aware of potential side effects but is tolerated without difficulty in the past -Keep blisters clean with soap and water -Patient will schedule physical for later this summer.  Follow-up as needed otherwise   I discussed the assessment and treatment plan with the patient. The patient was provided an opportunity to ask questions and all were answered. The patient agreed with the plan and demonstrated an understanding of the instructions.  The patient was advised to call back or seek an in-person evaluation if the symptoms worsen or if the condition fails to improve as anticipated.   Carolann Littler, MD

## 2019-01-24 ENCOUNTER — Encounter: Payer: Self-pay | Admitting: Family Medicine

## 2019-01-24 ENCOUNTER — Other Ambulatory Visit: Payer: Self-pay

## 2019-01-24 ENCOUNTER — Ambulatory Visit (INDEPENDENT_AMBULATORY_CARE_PROVIDER_SITE_OTHER): Payer: Managed Care, Other (non HMO) | Admitting: Family Medicine

## 2019-01-24 VITALS — BP 108/68 | HR 68 | Temp 97.4°F | Ht 68.6 in | Wt 168.8 lb

## 2019-01-24 DIAGNOSIS — Z0000 Encounter for general adult medical examination without abnormal findings: Secondary | ICD-10-CM

## 2019-01-24 LAB — LIPID PANEL
Cholesterol: 196 mg/dL (ref 0–200)
HDL: 51.3 mg/dL (ref >39.00)
LDL Cholesterol: 125 mg/dL — ABNORMAL HIGH (ref 0–99)
NonHDL: 144.82
Total CHOL/HDL Ratio: 4
Triglycerides: 101 mg/dL (ref 0.0–149.0)
VLDL: 20.2 mg/dL (ref 0.0–40.0)

## 2019-01-24 LAB — PSA: PSA: 0.59 ng/mL (ref 0.10–4.00)

## 2019-01-24 LAB — HEPATIC FUNCTION PANEL
ALT: 19 U/L (ref 0–53)
AST: 22 U/L (ref 0–37)
Albumin: 4.4 g/dL (ref 3.5–5.2)
Alkaline Phosphatase: 55 U/L (ref 39–117)
Bilirubin, Direct: 0.1 mg/dL (ref 0.0–0.3)
Total Bilirubin: 0.7 mg/dL (ref 0.2–1.2)
Total Protein: 6.7 g/dL (ref 6.0–8.3)

## 2019-01-24 LAB — BASIC METABOLIC PANEL
BUN: 14 mg/dL (ref 6–23)
CO2: 28 mEq/L (ref 19–32)
Calcium: 9.9 mg/dL (ref 8.4–10.5)
Chloride: 102 mEq/L (ref 96–112)
Creatinine, Ser: 0.99 mg/dL (ref 0.40–1.50)
GFR: 79.97 mL/min (ref >60.00)
Glucose, Bld: 88 mg/dL (ref 70–99)
Potassium: 4 mEq/L (ref 3.5–5.1)
Sodium: 137 mEq/L (ref 135–145)

## 2019-01-24 LAB — CBC WITH DIFFERENTIAL/PLATELET
Basophils Absolute: 0 10*3/uL (ref 0.0–0.1)
Basophils Relative: 0.6 % (ref 0.0–3.0)
Eosinophils Absolute: 0.1 10*3/uL (ref 0.0–0.7)
Eosinophils Relative: 1.5 % (ref 0.0–5.0)
HCT: 45.9 % (ref 39.0–52.0)
Hemoglobin: 15.5 g/dL (ref 13.0–17.0)
Lymphocytes Relative: 16.5 % (ref 12.0–46.0)
Lymphs Abs: 1.2 10*3/uL (ref 0.7–4.0)
MCHC: 33.8 g/dL (ref 30.0–36.0)
MCV: 94.2 fl (ref 78.0–100.0)
Monocytes Absolute: 0.8 10*3/uL (ref 0.1–1.0)
Monocytes Relative: 10.4 % (ref 3.0–12.0)
Neutro Abs: 5.2 10*3/uL (ref 1.4–7.7)
Neutrophils Relative %: 71 % (ref 43.0–77.0)
Platelets: 218 10*3/uL (ref 150.0–400.0)
RBC: 4.87 Mil/uL (ref 4.22–5.81)
RDW: 13 % (ref 11.5–15.5)
WBC: 7.3 10*3/uL (ref 4.0–10.5)

## 2019-01-24 LAB — TSH: TSH: 0.97 u[IU]/mL (ref 0.35–4.50)

## 2019-01-24 NOTE — Progress Notes (Signed)
Subjective:     Patient ID: Warren Barrera, male   DOB: 07-May-1971, 48 y.o.   MRN: [id number]  HPI Warren Barrera is here for physical exam.  He has history of mild hyperlipidemia.  His father had coronary disease in his 7040s.  He has had previous low coronary calcium score.  He is no longer able to run because of knee difficulties but walks about 3 miles a day and also does some cycling  Is married and has 2 children.  He teaches at [employer].  His wife is a Education officer, communitydentist. Non-smoker.  He turned 48 recently.  No history of colonoscopy.  Tetanus 2018.  He gets annual flu vaccine.  Past Medical History:  Diagnosis Date  . Acute sinusitis, unspecified 04/20/2009  . Allergy    Past Surgical History:  Procedure Laterality Date  . KNEE ARTHROSCOPY W/ MENISCAL REPAIR  1997    reports that he has never smoked. He has never used smokeless tobacco. He reports that he does not drink alcohol or use drugs. family history includes Cancer in an other family member; Heart attack in his paternal grandfather and paternal grandmother; Heart disease (age of onset: 2546) in his father; Hyperlipidemia in his father; Hypertension in an other family member. Allergies  Allergen Reactions  . Oxycodone-Acetaminophen     REACTION: rash from Percocet     Review of Systems  Constitutional: Negative for activity change, appetite change, fatigue, fever and unexpected weight change.  HENT: Negative for congestion, ear pain and trouble swallowing.   Eyes: Negative for pain and visual disturbance.  Respiratory: Negative for cough, shortness of breath and wheezing.   Cardiovascular: Negative for chest pain and palpitations.  Gastrointestinal: Negative for abdominal distention, abdominal pain, blood in stool, constipation, diarrhea, nausea, rectal pain and vomiting.  Endocrine: Negative for polydipsia and polyuria.  Genitourinary: Negative for dysuria, hematuria and testicular pain.  Musculoskeletal: Negative for  arthralgias and joint swelling.  Skin: Negative for rash.  Neurological: Negative for dizziness, syncope and headaches.  Hematological: Negative for adenopathy.  Psychiatric/Behavioral: Negative for confusion and dysphoric mood.       Objective:   Physical Exam Constitutional:      General: He is not in acute distress.    Appearance: He is well-developed.  HENT:     Head: Normocephalic and atraumatic.     Right Ear: External ear normal.     Left Ear: External ear normal.  Eyes:     Conjunctiva/sclera: Conjunctivae normal.     Pupils: Pupils are equal, round, and reactive to light.  Neck:     Musculoskeletal: Normal range of motion and neck supple.     Thyroid: No thyromegaly.  Cardiovascular:     Rate and Rhythm: Normal rate and regular rhythm.     Heart sounds: Normal heart sounds. No murmur.  Pulmonary:     Effort: No respiratory distress.     Breath sounds: No wheezing or rales.  Abdominal:     General: Bowel sounds are normal. There is no distension.     Palpations: Abdomen is soft. There is no mass.     Tenderness: There is no abdominal tenderness. There is no guarding or rebound.  Genitourinary:    Prostate: Normal.     Rectum: Normal.  Musculoskeletal:     Right lower leg: No edema.     Left lower leg: No edema.  Lymphadenopathy:     Cervical: No cervical adenopathy.  Skin:    Findings: No rash.  Neurological:     Mental Status: He is alert and oriented to person, place, and time.     Cranial Nerves: No cranial nerve deficit.     Deep Tendon Reflexes: Reflexes normal.  Psychiatric:        Mood and Affect: Mood normal.        Thought Content: Thought content normal.        Assessment:     Physical exam.  Generally healthy 48 year old male.  We discussed the following health maintenance issues    Plan:     -Obtain screening labs -The natural history of prostate cancer and ongoing controversy regarding screening and potential treatment outcomes of  prostate cancer has been discussed with the patient. The meaning of a false positive PSA and a false negative PSA has been discussed. He indicates understanding of the limitations of this screening test and wishes to proceed with screening PSA testing. -Set up referral for screening colonoscopy -Discussed Shingrix vaccine and he will consider -Continue with annual flu vaccine  Eulas Post MD [facility]

## 2019-01-24 NOTE — Patient Instructions (Signed)
Consider shingles vaccine (Shingrix) and let us know if interested.  

## 2019-04-04 ENCOUNTER — Encounter: Payer: Self-pay | Admitting: Family Medicine

## 2019-04-04 ENCOUNTER — Other Ambulatory Visit: Payer: Self-pay

## 2019-04-04 ENCOUNTER — Ambulatory Visit: Payer: Managed Care, Other (non HMO) | Admitting: Family Medicine

## 2019-04-04 VITALS — BP 122/78 | HR 68 | Temp 97.6°F | Ht 68.5 in | Wt 166.3 lb

## 2019-04-04 DIAGNOSIS — H00014 Hordeolum externum left upper eyelid: Secondary | ICD-10-CM | POA: Diagnosis not present

## 2019-04-04 MED ORDER — POLYMYXIN B-TRIMETHOPRIM 10000-0.1 UNIT/ML-% OP SOLN: 2.0000 [drp] | OPHTHALMIC | 0 refills | Status: DC

## 2019-04-04 NOTE — Patient Instructions (Signed)
Continue with warm compresses 3-4 times daily  Start the antibiotic eye drops  Let me know if not clearing in one week.

## 2019-04-04 NOTE — Progress Notes (Signed)
  Subjective:     Patient ID: Warren Barrera, male   DOB: 04/25/1971, 48 y.o.   MRN: [id number]  HPI   Left eye swelling and irritation.  Started last Wednesday.  No some tenderness of the upper lid.  No visible rash.  He did have some mild swelling and redness of the mostly upper lid over the weekend.  By Saturday he had some crusting and this was lesser extent on Sunday.  He has had some increased tearing of the.  No contact use.  No blurred vision.  He did use some warm compresses  Past Medical History:  Diagnosis Date  . Acute sinusitis, unspecified 04/20/2009  . Allergy    Past Surgical History:  Procedure Laterality Date  . KNEE ARTHROSCOPY W/ MENISCAL REPAIR  1997    reports that he has never smoked. He has never used smokeless tobacco. He reports that he does not drink alcohol or use drugs. family history includes Cancer in an other family member; Heart attack in his paternal grandfather and paternal grandmother; Heart disease (age of onset: 54) in his father; Hyperlipidemia in his father; Hypertension in an other family member. Allergies  Allergen Reactions  . Oxycodone-Acetaminophen     REACTION: rash from Percocet     Review of Systems  Constitutional: Negative for chills and fever.  Eyes: Positive for pain, discharge and redness. Negative for photophobia and visual disturbance.       Objective:   Physical Exam Constitutional:      Appearance: Normal appearance.  Eyes:     Pupils: Pupils are equal, round, and reactive to light.     Comments: Conjunctive appear relatively normal.  He does have some mild swelling of the left upper lid with some mild erythema along the midportion of the lid with some localized tenderness and swelling there.  No purulent secretions noted.  Cornea appears normal  Cardiovascular:     Rate and Rhythm: Normal rate and regular rhythm.  Neurological:     Mental Status: He is alert.        Assessment:     Probable small hordeolum left  upper lid.  This appears to be already be draining by history.  No evidence for conjunctivitis    Plan:     -Continue warm compresses several times daily -Polytrim ophthalmic drops 2 drops left eye every 4 hours while awake -Touch base in 1 week if not resolving  Eulas Post MD [facility]

## 2019-08-20 ENCOUNTER — Ambulatory Visit: Payer: Managed Care, Other (non HMO) | Attending: Internal Medicine

## 2019-08-20 DIAGNOSIS — Z23 Encounter for immunization: Secondary | ICD-10-CM

## 2019-08-20 NOTE — Progress Notes (Signed)
   Covid-19 Vaccination Clinic  Name:  Warren Barrera    MRN: [id number] DOB: 08-02-1970  08/20/2019  Warren Barrera was observed post Covid-19 immunization for 15 minutes without incidence. He was provided with Vaccine Information Sheet and instruction to access the V-Safe system.   Warren Barrera was instructed to call 911 with any severe reactions post vaccine: Marland Kitchen Difficulty breathing  . Swelling of your face and throat  . A fast heartbeat  . A bad rash all over your body  . Dizziness and weakness    Immunizations Administered    Name Date Dose VIS Date Route   Pfizer COVID-19 Vaccine 08/20/2019 11:13 AM 0.3 mL 06/03/2019 Intramuscular   Manufacturer: [employer], [employer]   Lot: [id number]   NDC: [address]-1

## 2019-09-10 ENCOUNTER — Ambulatory Visit: Payer: Managed Care, Other (non HMO) | Attending: Internal Medicine

## 2019-09-10 DIAGNOSIS — Z23 Encounter for immunization: Secondary | ICD-10-CM

## 2019-09-10 NOTE — Progress Notes (Signed)
   Covid-19 Vaccination Clinic  Name:  Warren Barrera    MRN: [id number] DOB: 13-Sep-1970  09/10/2019  Mr. Warren Barrera was observed post Covid-19 immunization for 15 minutes without incident. He was provided with Vaccine Information Sheet and instruction to access the V-Safe system.   Mr. Warren Barrera was instructed to call 911 with any severe reactions post vaccine: Marland Kitchen Difficulty breathing  . Swelling of face and throat  . A fast heartbeat  . A bad rash all over body  . Dizziness and weakness   Immunizations Administered    Name Date Dose VIS Date Route   Pfizer COVID-19 Vaccine 09/10/2019 11:49 AM 0.3 mL 06/03/2019 Intramuscular   Manufacturer: [employer], [employer]   Lot: [id number]   NDC: [address]-2

## 2019-09-14 ENCOUNTER — Ambulatory Visit: Payer: Managed Care, Other (non HMO)

## 2019-09-22 ENCOUNTER — Ambulatory Visit: Payer: Managed Care, Other (non HMO)

## 2019-11-30 ENCOUNTER — Ambulatory Visit: Payer: Self-pay

## 2019-11-30 ENCOUNTER — Telehealth: Payer: Self-pay

## 2019-11-30 ENCOUNTER — Ambulatory Visit: Payer: Managed Care, Other (non HMO) | Admitting: Orthopedic Surgery

## 2019-11-30 DIAGNOSIS — M1711 Unilateral primary osteoarthritis, right knee: Secondary | ICD-10-CM

## 2019-11-30 DIAGNOSIS — M1712 Unilateral primary osteoarthritis, left knee: Secondary | ICD-10-CM | POA: Diagnosis not present

## 2019-11-30 DIAGNOSIS — M25562 Pain in left knee: Secondary | ICD-10-CM

## 2019-11-30 DIAGNOSIS — M25561 Pain in right knee: Secondary | ICD-10-CM

## 2019-11-30 NOTE — Telephone Encounter (Signed)
Can we please get patient approved for bilateral gel inejctions?

## 2019-11-30 NOTE — Telephone Encounter (Signed)
Noted  

## 2019-12-03 ENCOUNTER — Encounter: Payer: Self-pay | Admitting: Orthopedic Surgery

## 2019-12-03 NOTE — Progress Notes (Signed)
Office Visit Note   Patient: Warren Barrera           Date of Birth: [birth year]           MRN: [id number] Visit Date: 11/30/2019 Requested by: Eulas Post, MD [place],  [place] [id number] PCP: Eulas Post, MD  Subjective: Chief Complaint  Patient presents with  . Right Knee - Pain  . Left Knee - Pain    HPI: Warren Barrera is a 49 y.o. male who presents to the office complaining of bilateral knee pain.  Patient notes increased pain over the last 3 weeks, primarily in the right knee.  Pain does not wake him up at night but he does have pain with increased activity and with kneeling.  He has previously received gel injections with good relief.  His first gel injection gave him 14 months of relief and then last injection gave him about 22 months of relief.  He localizes pain to the medial aspect of the bilateral knees.  Denies any mechanical symptoms or instability.  He has had cortisone injections in the past have not provided any relief.  He has a history of left knee surgery due to left patellar dislocation.  He is a former marathon runner.  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  Denies any groin pain, low back pain, radicular symptoms..                ROS:  All systems reviewed are negative as they relate to the chief complaint within the history of present illness.  Patient denies fevers or chills.  Assessment & Plan: Visit Diagnoses:  1. Pain in both knees, unspecified chronicity     Plan: Patient is a 50 year old male presents complaining of bilateral knee pain, primarily right knee.  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  Pain does not wake him up at night or cause mechanical/instability symptoms.  He has had gel injections in the past that are provided significantly longer relief.  Cortisone injections did not provide any relief for him.  He previously had Euflexxa.  We will  preapproved patient for Euflexxa as he has had impressive relief with these injections.  Follow-up after injections are approved.  This patient is diagnosed with osteoarthritis of the knee(s).    Radiographs show evidence of joint space narrowing, osteophytes, subchondral sclerosis and/or subchondral cysts.  This patient has knee pain which interferes with functional and activities of daily living.    This patient has experienced inadequate response, adverse effects and/or intolerance with conservative treatments such as acetaminophen, NSAIDS, topical creams, physical therapy or regular exercise, knee bracing and/or weight loss.   This patient has experienced inadequate response or has a contraindication to intra articular steroid injections for at least 3 months.   This patient is not scheduled to have a total knee replacement within 6 months of starting treatment with viscosupplementation.   Follow-Up Instructions: No follow-ups on file.   Orders:  Orders Placed This Encounter  Procedures  . XR Knee 1-2 Views Right  . XR KNEE 3 VIEW LEFT   No orders of the defined types were placed in this encounter.     Procedures: No procedures performed   Clinical Data: No additional findings.  Objective: Vital Signs: There were no vitals taken for this visit.  Physical Exam:  Constitutional: Patient appears well-developed HEENT:  Head: Normocephalic Eyes:EOM are normal Neck: Normal range of motion Cardiovascular: Normal rate Pulmonary/chest: Effort normal Neurologic: Patient is alert Skin: Skin is warm Psychiatric: Patient has normal mood and affect  Ortho Exam:  Bilateral knee Exam No effusion Tender to palpation over the medial joint lines bilaterally Extensor mechanism intact No TTP over the lateral joint lines, quad tendon, patellar tendon, pes anserinus, patella, tibial tubercle, LCL/MCL insertions Stable to varus/valgus stresses.  Stable to anterior/posterior  drawer Extension to 0 degrees Flexion > 90 degrees  Specialty Comments:  No specialty comments available.  Imaging: No results found.   PMFS History: Patient Active Problem List   Diagnosis Date Noted  . Hypercholesteremia 01/12/2012  . Reactive airway disease 06/03/2011  . ACUTE SINUSITIS, UNSPECIFIED 04/20/2009   Past Medical History:  Diagnosis Date  . Acute sinusitis, unspecified 04/20/2009  . Allergy     Family History  Problem Relation Age of Onset  . Heart disease Father 19       angioplasty  . Hyperlipidemia Father   . Cancer Other        prostate/fhx  . Hypertension Other        parent, grandparent  . Heart attack Paternal Grandmother   . Heart attack Paternal Grandfather     Past Surgical History:  Procedure Laterality Date  . KNEE ARTHROSCOPY W/ MENISCAL REPAIR  1997   Social History   Occupational History  . Not on file  Tobacco Use  . Smoking status: Never Smoker  . Smokeless tobacco: Never Used  Vaping Use  . Vaping Use: Never used  Substance and Sexual Activity  . Alcohol use: No  . Drug use: No  . Sexual activity: Not on file

## 2019-12-06 ENCOUNTER — Telehealth: Payer: Self-pay

## 2019-12-06 NOTE — Telephone Encounter (Signed)
Submitted VOB for Synvisc series, bilateral knee. 

## 2019-12-16 ENCOUNTER — Telehealth: Payer: Self-pay

## 2019-12-16 NOTE — Telephone Encounter (Signed)
PA required for Synvisc, bilateral knee. Faxed completed PA form to [place] at [phone].

## 2019-12-27 ENCOUNTER — Telehealth: Payer: Self-pay

## 2019-12-27 NOTE — Telephone Encounter (Signed)
Patient aware that he is approved for gel injection.  Approved, Synvisc series, bilateral knee. Buy & Bill Covered at 100% after Co-pay Co-pay of $50.00 required PA required PA Approval# [id number] Valid 12/16/2019- 02/10/2020

## 2020-01-02 ENCOUNTER — Ambulatory Visit: Payer: Managed Care, Other (non HMO) | Admitting: Orthopedic Surgery

## 2020-01-02 ENCOUNTER — Encounter: Payer: Self-pay | Admitting: Orthopedic Surgery

## 2020-01-02 DIAGNOSIS — M1711 Unilateral primary osteoarthritis, right knee: Secondary | ICD-10-CM | POA: Diagnosis not present

## 2020-01-02 DIAGNOSIS — M1712 Unilateral primary osteoarthritis, left knee: Secondary | ICD-10-CM

## 2020-01-02 MED ORDER — LIDOCAINE HCL 1 % IJ SOLN
5.0000 mL | INTRAMUSCULAR | Status: AC | PRN
  Administered 2020-01-02: 5 mL

## 2020-01-02 MED ORDER — HYLAN G-F 20 16 MG/2ML IX SOSY
16.0000 mg | PREFILLED_SYRINGE | INTRA_ARTICULAR | Status: AC | PRN
  Administered 2020-01-02: 16 mg via INTRA_ARTICULAR

## 2020-01-02 NOTE — Progress Notes (Signed)
   Procedure Note  Patient: Warren Barrera             Date of Birth: Oct 22, 1970           MRN: [id number]             Visit Date: 01/02/2020  Procedures: Visit Diagnoses:  1. Unilateral primary osteoarthritis, left knee   2. Unilateral primary osteoarthritis, right knee     Large Joint Inj: bilateral knee on 01/02/2020 9:20 PM Indications: diagnostic evaluation, joint swelling and pain Details: 18 G 1.5 in needle, superolateral approach  Arthrogram: No  Medications (Right): 5 mL lidocaine 1 %; 16 mg Hylan 16 MG/2ML Medications (Left): 5 mL lidocaine 1 %; 16 mg Hylan 16 MG/2ML Outcome: tolerated well, no immediate complications Procedure, treatment alternatives, risks and benefits explained, specific risks discussed. Consent was given by the patient. Immediately prior to procedure a time out was called to verify the correct patient, procedure, equipment, support staff and site/side marked as required. Patient was prepped and draped in the usual sterile fashion.

## 2020-01-11 ENCOUNTER — Ambulatory Visit: Payer: Managed Care, Other (non HMO) | Admitting: Orthopedic Surgery

## 2020-01-11 DIAGNOSIS — M1711 Unilateral primary osteoarthritis, right knee: Secondary | ICD-10-CM | POA: Diagnosis not present

## 2020-01-11 DIAGNOSIS — M1712 Unilateral primary osteoarthritis, left knee: Secondary | ICD-10-CM | POA: Diagnosis not present

## 2020-01-14 ENCOUNTER — Encounter: Payer: Self-pay | Admitting: Orthopedic Surgery

## 2020-01-14 DIAGNOSIS — M1712 Unilateral primary osteoarthritis, left knee: Secondary | ICD-10-CM | POA: Diagnosis not present

## 2020-01-14 DIAGNOSIS — M1711 Unilateral primary osteoarthritis, right knee: Secondary | ICD-10-CM

## 2020-01-14 MED ORDER — HYLAN G-F 20 16 MG/2ML IX SOSY
16.0000 mg | PREFILLED_SYRINGE | INTRA_ARTICULAR | Status: AC | PRN
  Administered 2020-01-14: 16 mg via INTRA_ARTICULAR

## 2020-01-14 MED ORDER — LIDOCAINE HCL 1 % IJ SOLN
5.0000 mL | INTRAMUSCULAR | Status: AC | PRN
  Administered 2020-01-14: 5 mL

## 2020-01-14 NOTE — Progress Notes (Signed)
   Procedure Note  Patient: Warren Barrera             Date of Birth: 1971-05-17           MRN: [id number]             Visit Date: 01/11/2020  Procedures: Visit Diagnoses:  1. Unilateral primary osteoarthritis, left knee   2. Unilateral primary osteoarthritis, right knee     Large Joint Inj: bilateral knee on 01/14/2020 1:41 PM Indications: diagnostic evaluation, joint swelling and pain Details: 18 G 1.5 in needle, superolateral approach  Arthrogram: No  Medications (Right): 5 mL lidocaine 1 %; 16 mg Hylan 16 MG/2ML Medications (Left): 5 mL lidocaine 1 %; 16 mg Hylan 16 MG/2ML Outcome: tolerated well, no immediate complications Procedure, treatment alternatives, risks and benefits explained, specific risks discussed. Consent was given by the patient. Immediately prior to procedure a time out was called to verify the correct patient, procedure, equipment, support staff and site/side marked as required. Patient was prepped and draped in the usual sterile fashion.

## 2020-01-23 ENCOUNTER — Ambulatory Visit: Payer: Managed Care, Other (non HMO) | Admitting: Orthopedic Surgery

## 2020-01-23 DIAGNOSIS — M1711 Unilateral primary osteoarthritis, right knee: Secondary | ICD-10-CM

## 2020-01-23 DIAGNOSIS — M1712 Unilateral primary osteoarthritis, left knee: Secondary | ICD-10-CM | POA: Diagnosis not present

## 2020-01-27 ENCOUNTER — Encounter: Payer: Managed Care, Other (non HMO) | Admitting: Family Medicine

## 2020-01-27 ENCOUNTER — Encounter: Payer: Self-pay | Admitting: Family Medicine

## 2020-01-27 ENCOUNTER — Encounter: Payer: Self-pay | Admitting: Orthopedic Surgery

## 2020-01-27 ENCOUNTER — Ambulatory Visit (INDEPENDENT_AMBULATORY_CARE_PROVIDER_SITE_OTHER): Payer: Managed Care, Other (non HMO) | Admitting: Family Medicine

## 2020-01-27 ENCOUNTER — Other Ambulatory Visit: Payer: Self-pay

## 2020-01-27 VITALS — BP 126/74 | HR 65 | Temp 98.2°F | Wt 167.2 lb

## 2020-01-27 DIAGNOSIS — M1711 Unilateral primary osteoarthritis, right knee: Secondary | ICD-10-CM

## 2020-01-27 DIAGNOSIS — Z0000 Encounter for general adult medical examination without abnormal findings: Secondary | ICD-10-CM

## 2020-01-27 DIAGNOSIS — M1712 Unilateral primary osteoarthritis, left knee: Secondary | ICD-10-CM

## 2020-01-27 MED ORDER — HYLAN G-F 20 16 MG/2ML IX SOSY
16.0000 mg | PREFILLED_SYRINGE | INTRA_ARTICULAR | Status: AC | PRN
  Administered 2020-01-27: 16 mg via INTRA_ARTICULAR

## 2020-01-27 MED ORDER — LIDOCAINE HCL 1 % IJ SOLN
5.0000 mL | INTRAMUSCULAR | Status: AC | PRN
  Administered 2020-01-27: 5 mL

## 2020-01-27 NOTE — Progress Notes (Signed)
Established Patient Office Visit  Subjective:  Patient ID: Warren Barrera, male    DOB: Nov 06, 1970  Age: 49 y.o. MRN: [id number]  CC:  Chief Complaint  Patient presents with  . Annual Exam    pt has no new concerns    HPI Warren Barrera presents for physical exam.  Has had some ongoing issues with his knees and has been followed by orthopedist and getting injections.  He is still exercising regularly.  No longer runs regularly.  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  He is married with 2 children.  His wife is a Education officer, community.  He has had Covid vaccine.  Had tetanus 2018.  Has not had screening colonoscopy yet.  We had made referral but this was right before the pandemic and he would like to go ahead and get that rescheduled.  He plans to call.  No history of shingles vaccine.  Past Medical History:  Diagnosis Date  . Acute sinusitis, unspecified 04/20/2009  . Allergy     Past Surgical History:  Procedure Laterality Date  . KNEE ARTHROSCOPY W/ MENISCAL REPAIR  1997    Family History  Problem Relation Age of Onset  . Heart disease Father 27       angioplasty  . Hyperlipidemia Father   . Cancer Other        prostate/fhx  . Hypertension Other        parent, grandparent  . Heart attack Paternal Grandmother   . Heart attack Paternal Grandfather     Social History   Socioeconomic History  . Marital status: Married    Spouse name: Not on file  . Number of children: Not on file  . Years of education: Not on file  . Highest education level: Not on file  Occupational History  . Not on file  Tobacco Use  . Smoking status: Never Smoker  . Smokeless tobacco: Never Used  Vaping Use  . Vaping Use: Never used  Substance and Sexual Activity  . Alcohol use: No  . Drug use: No  . Sexual activity: Not on file  Other Topics Concern  . Not on file  Social History Narrative  . Not on file   Social Determinants of Health   Financial Resource Strain:   . Difficulty of  Paying Living Expenses:   Food Insecurity:   . Worried About Programme researcher, broadcasting/film/video in the Last Year:   . Barista in the Last Year:   Transportation Needs:   . Freight forwarder (Medical):   Marland Kitchen Lack of Transportation (Non-Medical):   Physical Activity:   . Days of Exercise per Week:   . Minutes of Exercise per Session:   Stress:   . Feeling of Stress :   Social Connections:   . Frequency of Communication with Friends and Family:   . Frequency of Social Gatherings with Friends and Family:   . Attends Religious Services:   . Active Member of Clubs or Organizations:   . Attends Banker Meetings:   Marland Kitchen Marital Status:   Intimate Partner Violence:   . Fear of Current or Ex-Partner:   . Emotionally Abused:   Marland Kitchen Physically Abused:   . Sexually Abused:     Outpatient Medications Prior to Visit  Medication Sig Dispense Refill  . Ascorbic Acid (VITAMIN C) 100 MG tablet Take 100 mg by mouth daily.      Marland Kitchen glucosamine-chondroitin 500-400 MG tablet Take 1 tablet by mouth  daily.    . Multiple Vitamin (MULTIVITAMIN) tablet Take 1 tablet by mouth daily.      Marland Kitchen trimethoprim-polymyxin b (POLYTRIM) ophthalmic solution Place 2 drops into the left eye every 4 (four) hours. 10 mL 0  . Turmeric 500 MG CAPS Take 2 capsules by mouth daily.     No facility-administered medications prior to visit.    Allergies  Allergen Reactions  . Oxycodone-Acetaminophen     REACTION: rash from Percocet    ROS Review of Systems  Constitutional: Negative for activity change, appetite change, fatigue, fever and unexpected weight change.  HENT: Negative for congestion, ear pain and trouble swallowing.   Eyes: Negative for pain and visual disturbance.  Respiratory: Negative for cough, shortness of breath and wheezing.   Cardiovascular: Negative for chest pain and palpitations.  Gastrointestinal: Negative for abdominal distention, abdominal pain, blood in stool, constipation, diarrhea, nausea,  rectal pain and vomiting.  Endocrine: Negative for polydipsia and polyuria.  Genitourinary: Negative for dysuria, hematuria and testicular pain.  Skin: Negative for rash.  Neurological: Negative for dizziness, syncope and headaches.  Hematological: Negative for adenopathy.  Psychiatric/Behavioral: Negative for confusion and dysphoric mood.      Objective:    Physical Exam Constitutional:      General: He is not in acute distress.    Appearance: He is well-developed.  HENT:     Head: Normocephalic and atraumatic.     Right Ear: External ear normal.     Left Ear: External ear normal.  Eyes:     Conjunctiva/sclera: Conjunctivae normal.     Pupils: Pupils are equal, round, and reactive to light.  Neck:     Thyroid: No thyromegaly.  Cardiovascular:     Rate and Rhythm: Normal rate and regular rhythm.     Heart sounds: Normal heart sounds. No murmur heard.   Pulmonary:     Effort: No respiratory distress.     Breath sounds: No wheezing or rales.  Abdominal:     General: Bowel sounds are normal. There is no distension.     Palpations: Abdomen is soft. There is no mass.     Tenderness: There is no abdominal tenderness. There is no guarding or rebound.  Musculoskeletal:     Cervical back: Normal range of motion and neck supple.     Comments: Small effusion right knee  Lymphadenopathy:     Cervical: No cervical adenopathy.  Skin:    Findings: No rash.  Neurological:     Mental Status: He is alert and oriented to person, place, and time.     Cranial Nerves: No cranial nerve deficit.     Deep Tendon Reflexes: Reflexes normal.     BP 126/74 (BP Location: Left Arm, Patient Position: Sitting, Cuff Size: Normal)   Pulse 65   Temp 98.2 F (36.8 C) (Oral)   Wt 167 lb 3.2 oz (75.8 kg)   SpO2 99%   BMI 25.05 kg/m  Wt Readings from Last 3 Encounters:  01/27/20 167 lb 3.2 oz (75.8 kg)  04/04/19 166 lb 4.8 oz (75.4 kg)  01/24/19 168 lb 12.8 oz (76.6 kg)     Health  Maintenance Due  Topic Date Due  . COLONOSCOPY  Never done  . INFLUENZA VACCINE  01/22/2020    There are no preventive care reminders to display for this patient.  Lab Results  Component Value Date   TSH 0.97 01/24/2019   Lab Results  Component Value Date   WBC 7.3 01/24/2019  HGB 15.5 01/24/2019   HCT 45.9 01/24/2019   MCV 94.2 01/24/2019   PLT 218.0 01/24/2019   Lab Results  Component Value Date   NA 137 01/24/2019   K 4.0 01/24/2019   CO2 28 01/24/2019   GLUCOSE 88 01/24/2019   BUN 14 01/24/2019   CREATININE 0.99 01/24/2019   BILITOT 0.7 01/24/2019   ALKPHOS 55 01/24/2019   AST 22 01/24/2019   ALT 19 01/24/2019   PROT 6.7 01/24/2019   ALBUMIN 4.4 01/24/2019   CALCIUM 9.9 01/24/2019   GFR 79.97 01/24/2019   Lab Results  Component Value Date   CHOL 196 01/24/2019   Lab Results  Component Value Date   HDL 51.30 01/24/2019   Lab Results  Component Value Date   LDLCALC 125 (H) 01/24/2019   Lab Results  Component Value Date   TRIG 101.0 01/24/2019   Lab Results  Component Value Date   CHOLHDL 4 01/24/2019   No results found for: HGBA1C    Assessment & Plan:   Problem List Items Addressed This Visit    None    Visit Diagnoses    Physical exam    -  Primary   Relevant Orders   Basic metabolic panel   Lipid panel   CBC with Differential/Platelet   TSH   Hepatic function panel   PSA    Generally healthy 49 year old male.  We discussed the following health maintenance issues  -Covid vaccine already given -We discussed Shingrix vaccine and he will check on coverage and consider at some point this year -Call to set up repeat colonoscopy.  He has been referred previously and he will initiate that call and we can make referral if necessary -Check screening labs above  No orders of the defined types were placed in this encounter.   Follow-up: No follow-ups on file.    Evelena Peat, MD

## 2020-01-27 NOTE — Progress Notes (Signed)
° °  Procedure Note  Patient: Warren Barrera             Date of Birth: 1971/01/27           MRN: [id number]             Visit Date: 01/23/2020  Procedures: Visit Diagnoses:  1. Unilateral primary osteoarthritis, left knee   2. Unilateral primary osteoarthritis, right knee     Large Joint Inj: bilateral knee on 01/27/2020 7:07 AM Indications: diagnostic evaluation, joint swelling and pain Details: 18 G 1.5 in needle, superolateral approach  Arthrogram: No  Medications (Right): 5 mL lidocaine 1 %; 16 mg Hylan 16 MG/2ML Medications (Left): 5 mL lidocaine 1 %; 16 mg Hylan 16 MG/2ML Outcome: tolerated well, no immediate complications Procedure, treatment alternatives, risks and benefits explained, specific risks discussed. Consent was given by the patient. Immediately prior to procedure a time out was called to verify the correct patient, procedure, equipment, support staff and site/side marked as required. Patient was prepped and draped in the usual sterile fashion.

## 2020-01-27 NOTE — Patient Instructions (Signed)
Consider setting up colonoscopy at some point this year  Consider Shingrix (shingles) vaccine.

## 2020-01-28 LAB — LIPID PANEL
Cholesterol: 196 mg/dL (ref <200)
HDL: 49 mg/dL (ref >=40)
LDL Cholesterol (Calc): 122 mg/dL (calc) — ABNORMAL HIGH
Non-HDL Cholesterol (Calc): 147 mg/dL (calc) — ABNORMAL HIGH (ref <130)
Total CHOL/HDL Ratio: 4 (calc) (ref <5.0)
Triglycerides: 137 mg/dL (ref <150)

## 2020-01-28 LAB — HEPATIC FUNCTION PANEL
AG Ratio: 2 (calc) (ref 1.0–2.5)
ALT: 19 U/L (ref 9–46)
AST: 20 U/L (ref 10–35)
Albumin: 4.5 g/dL (ref 3.6–5.1)
Alkaline phosphatase (APISO): 64 U/L (ref 35–144)
Bilirubin, Direct: 0.1 mg/dL (ref 0.0–0.2)
Globulin: 2.2 g/dL (calc) (ref 1.9–3.7)
Indirect Bilirubin: 0.4 mg/dL (calc) (ref 0.2–1.2)
Total Bilirubin: 0.5 mg/dL (ref 0.2–1.2)
Total Protein: 6.7 g/dL (ref 6.1–8.1)

## 2020-01-28 LAB — CBC WITH DIFFERENTIAL/PLATELET
Absolute Monocytes: 639 cells/uL (ref 200–950)
Basophils Absolute: 43 cells/uL (ref 0–200)
Basophils Relative: 0.6 %
Eosinophils Absolute: 170 cells/uL (ref 15–500)
Eosinophils Relative: 2.4 %
HCT: 46.1 % (ref 38.5–50.0)
Hemoglobin: 15.7 g/dL (ref 13.2–17.1)
Lymphs Abs: 1086 cells/uL (ref 850–3900)
MCH: 31.3 pg (ref 27.0–33.0)
MCHC: 34.1 g/dL (ref 32.0–36.0)
MCV: 92 fL (ref 80.0–100.0)
MPV: 9.6 fL (ref 7.5–12.5)
Monocytes Relative: 9 %
Neutro Abs: 5162 cells/uL (ref 1500–7800)
Neutrophils Relative %: 72.7 %
Platelets: 226 10*3/uL (ref 140–400)
RBC: 5.01 10*6/uL (ref 4.20–5.80)
RDW: 12.3 % (ref 11.0–15.0)
Total Lymphocyte: 15.3 %
WBC: 7.1 10*3/uL (ref 3.8–10.8)

## 2020-01-28 LAB — BASIC METABOLIC PANEL
BUN: 12 mg/dL (ref 7–25)
CO2: 25 mmol/L (ref 20–32)
Calcium: 9.8 mg/dL (ref 8.6–10.3)
Chloride: 101 mmol/L (ref 98–110)
Creat: 0.91 mg/dL (ref 0.70–1.33)
Glucose, Bld: 95 mg/dL (ref 65–99)
Potassium: 4.2 mmol/L (ref 3.5–5.3)
Sodium: 138 mmol/L (ref 135–146)

## 2020-01-28 LAB — TSH: TSH: 1.22 mIU/L (ref 0.40–4.50)

## 2020-01-28 LAB — PSA: PSA: 0.8 ng/mL (ref <=4.0)

## 2020-01-30 ENCOUNTER — Other Ambulatory Visit: Payer: Self-pay

## 2020-01-30 ENCOUNTER — Ambulatory Visit: Payer: Managed Care, Other (non HMO) | Admitting: Orthopedic Surgery

## 2020-01-30 ENCOUNTER — Telehealth: Payer: Self-pay | Admitting: Radiology

## 2020-01-30 DIAGNOSIS — M25462 Effusion, left knee: Secondary | ICD-10-CM

## 2020-01-30 DIAGNOSIS — M25461 Effusion, right knee: Secondary | ICD-10-CM

## 2020-01-30 NOTE — Telephone Encounter (Signed)
Tried calling patient. No answer. LMVM for him to call office to get worked in to see Dr August Saucer. Patient will likely have a wait due to the amount of patients on Dr Alfonso Patten schedule this afternoon.

## 2020-01-30 NOTE — Telephone Encounter (Signed)
Patient left voicemail on triage line this morning requesting appointment today or tomorrow with Dr. August Saucer. He had the last of the series of Synvisc injections bilateral knees on 01/23/2020. He is now complaining with right knee pain and swelling, difficulty going up and down stairs, and states that the knee feels unstable due to being puffy.   Please advise on where you would like for me to work patient in and I will be glad to call him for appt.  CB for pt [phone]

## 2020-01-31 ENCOUNTER — Encounter: Payer: Self-pay | Admitting: Orthopedic Surgery

## 2020-01-31 NOTE — Progress Notes (Signed)
   Post-Op Visit Note   Patient: Warren Barrera           Date of Birth: March 14, 1971           MRN: [id number] Visit Date: 01/30/2020 PCP: Kristian Covey, MD   Assessment & Plan:  Chief Complaint:  Chief Complaint  Patient presents with  . Right Knee - Pain  . Left Knee - Pain   Visit Diagnoses:  1. Effusion, right knee   2. Effusion, left knee     Plan: Warren Barrera is a 49 year old patient bilateral knee arthritis.  Had since last series of Synvisc injection on 8-21.  Developed some knee swelling 2 days ago.  No fevers or chills.  On exam he does have moderate knee effusion on the right and left hand side.  Impression is possible allergic reaction to the Synvisc.  No interval history of trauma.  No history of gout or pseudogout.  Plan is aspiration of both knees.  We got about 90 cc of serosanguineous fluid out on the right and about 40 on the left.  Injected each with Toradol and Marcaine.  Send the fluid off for Gram stain cell count aerobic and anaerobic culture and crystals.  We will call him with those results.  Follow-Up Instructions: Return if symptoms worsen or fail to improve.   Orders:  Orders Placed This Encounter  Procedures  . Gram stain  . Anaerobic and Aerobic Culture  . Gram stain  . Anaerobic and Aerobic Culture  . Cell count + diff,  w/ cryst-synvl fld  . Cell count + diff,  w/ cryst-synvl fld   No orders of the defined types were placed in this encounter.   Imaging: No results found.  PMFS History: Patient Active Problem List   Diagnosis Date Noted  . Hypercholesteremia 01/12/2012  . Reactive airway disease 06/03/2011  . ACUTE SINUSITIS, UNSPECIFIED 04/20/2009   Past Medical History:  Diagnosis Date  . Acute sinusitis, unspecified 04/20/2009  . Allergy     Family History  Problem Relation Age of Onset  . Heart disease Father 59       angioplasty  . Hyperlipidemia Father   . Cancer Other        prostate/fhx  . Hypertension Other         parent, grandparent  . Heart attack Paternal Grandmother   . Heart attack Paternal Grandfather     Past Surgical History:  Procedure Laterality Date  . KNEE ARTHROSCOPY W/ MENISCAL REPAIR  1997   Social History   Occupational History  . Not on file  Tobacco Use  . Smoking status: Never Smoker  . Smokeless tobacco: Never Used  Vaping Use  . Vaping Use: Never used  Substance and Sexual Activity  . Alcohol use: No  . Drug use: No  . Sexual activity: Not on file

## 2020-02-05 LAB — ANAEROBIC AND AEROBIC CULTURE
AER RESULT:: NO GROWTH
AER RESULT:: NO GROWTH
MICRO NUMBER:: 10803002
MICRO NUMBER:: 10803003
MICRO NUMBER:: 10803008
MICRO NUMBER:: 10803009
SPECIMEN QUALITY:: ADEQUATE
SPECIMEN QUALITY:: ADEQUATE
SPECIMEN QUALITY:: ADEQUATE
SPECIMEN QUALITY:: ADEQUATE

## 2020-02-05 LAB — SYNOVIAL CELL COUNT + DIFF, W/ CRYSTALS
Basophils, %: 0 %
Basophils, %: 0 %
Eosinophils-Synovial: 0 % (ref 0–2)
Eosinophils-Synovial: 0 % (ref 0–2)
Lymphocytes-Synovial Fld: 4 % (ref 0–74)
Lymphocytes-Synovial Fld: 58 % (ref 0–74)
Monocyte/Macrophage: 1 % (ref 0–69)
Monocyte/Macrophage: 8 % (ref 0–69)
Neutrophil, Synovial: 41 % — ABNORMAL HIGH (ref 0–24)
Neutrophil, Synovial: 88 % — ABNORMAL HIGH (ref 0–24)
Synoviocytes, %: 0 % (ref 0–15)
Synoviocytes, %: 0 % (ref 0–15)
WBC, Synovial: 2887 cells/uL — ABNORMAL HIGH (ref <150)
WBC, Synovial: 7039 cells/uL — ABNORMAL HIGH (ref <150)

## 2020-02-05 NOTE — Progress Notes (Signed)
Hi Lauren can you call Warren Barrera and tell him no infection no gout or pseudogout this looks like a reaction to the shot.

## 2020-05-05 ENCOUNTER — Ambulatory Visit: Payer: Managed Care, Other (non HMO) | Attending: Internal Medicine

## 2020-05-05 DIAGNOSIS — Z23 Encounter for immunization: Secondary | ICD-10-CM

## 2020-05-05 NOTE — Progress Notes (Signed)
   Covid-19 Vaccination Clinic  Name:  Warren Barrera    MRN: [id number] DOB: [birth year]  05/05/2020  [name] was observed post Covid-19 immunization for 15 minutes without incident. He was provided with Vaccine Information Sheet and instruction to access the V-Safe system.   [name] was instructed to call 911 with any severe reactions post vaccine: Marland Kitchen Difficulty breathing  . Swelling of face and throat  . A fast heartbeat  . A bad rash all over body  . Dizziness and weakness   Immunizations Administered    Name Date Dose VIS Date Route   Pfizer COVID-19 Vaccine 05/05/2020  5:12 PM 0.3 mL 04/11/2020 Intramuscular   Manufacturer: [employer], [employer]   Lot: [id number]   NDC: [address]-9

## 2020-09-28 ENCOUNTER — Encounter: Payer: Self-pay | Admitting: Family Medicine

## 2020-09-28 ENCOUNTER — Other Ambulatory Visit: Payer: Self-pay

## 2020-09-28 ENCOUNTER — Ambulatory Visit: Payer: Managed Care, Other (non HMO) | Admitting: Family Medicine

## 2020-09-28 VITALS — BP 128/88 | HR 78 | Temp 98.1°F | Wt 178.4 lb

## 2020-09-28 DIAGNOSIS — H6123 Impacted cerumen, bilateral: Secondary | ICD-10-CM | POA: Diagnosis not present

## 2020-09-28 DIAGNOSIS — J988 Other specified respiratory disorders: Secondary | ICD-10-CM

## 2020-09-28 NOTE — Progress Notes (Signed)
Established Patient Office Visit  Subjective:  Patient ID: Warren Barrera, male    DOB: 05/11/1971  Age: 50 y.o. MRN: [id number]  CC:  Chief Complaint  Patient presents with  . Ear Fullness    X 2 months, drainage, head pressure, runny nose    HPI Warren Barrera presents for almost 50-month history of ear fullness left greater than right.  He also states he has had some increased "congestion" nasally and upper airway for the past couple months.  This started well before the pollen got heavy.  He states couple of weeks ago his son was diagnosed with influenza.  Never had any fever.  No cough.  Does have possibly some postnasal drip especially at night.  Is not aware of any obvious GERD symptoms.  No dysphagia.  He has had cerumen impactions in the past.  This feels somewhat similar.  No significant ear pain.  Past Medical History:  Diagnosis Date  . Acute sinusitis, unspecified 04/20/2009  . Allergy     Past Surgical History:  Procedure Laterality Date  . KNEE ARTHROSCOPY W/ MENISCAL REPAIR  1997    Family History  Problem Relation Age of Onset  . Heart disease Father 76       angioplasty  . Hyperlipidemia Father   . Cancer Other        prostate/fhx  . Hypertension Other        parent, grandparent  . Heart attack Paternal Grandmother   . Heart attack Paternal Grandfather     Social History   Socioeconomic History  . Marital status: Married    Spouse name: Not on file  . Number of children: Not on file  . Years of education: Not on file  . Highest education level: Not on file  Occupational History  . Not on file  Tobacco Use  . Smoking status: Never Smoker  . Smokeless tobacco: Never Used  Vaping Use  . Vaping Use: Never used  Substance and Sexual Activity  . Alcohol use: No  . Drug use: No  . Sexual activity: Not on file  Other Topics Concern  . Not on file  Social History Narrative  . Not on file   Social Determinants of Health   Financial Resource  Strain: Not on file  Food Insecurity: Not on file  Transportation Needs: Not on file  Physical Activity: Not on file  Stress: Not on file  Social Connections: Not on file  Intimate Partner Violence: Not on file    Outpatient Medications Prior to Visit  Medication Sig Dispense Refill  . Ascorbic Acid (VITAMIN C) 100 MG tablet Take 100 mg by mouth daily.    Marland Kitchen glucosamine-chondroitin 500-400 MG tablet Take 1 tablet by mouth daily.    . Multiple Vitamin (MULTIVITAMIN) tablet Take 1 tablet by mouth daily.    . Turmeric 500 MG CAPS Take 2 capsules by mouth daily.    Marland Kitchen trimethoprim-polymyxin b (POLYTRIM) ophthalmic solution Place 2 drops into the left eye every 4 (four) hours. 10 mL 0   No facility-administered medications prior to visit.    Allergies  Allergen Reactions  . Oxycodone-Acetaminophen     REACTION: rash from Percocet    ROS Review of Systems  Constitutional: Negative for chills and fever.  HENT: Positive for ear discharge. Negative for ear pain and sore throat.   Respiratory: Negative for cough and shortness of breath.   Cardiovascular: Negative for chest pain.      Objective:  Physical Exam Vitals reviewed.  Constitutional:      Appearance: Normal appearance.  HENT:     Ears:     Comments: Bilateral cerumen impactions Cardiovascular:     Rate and Rhythm: Normal rate and regular rhythm.  Pulmonary:     Effort: Pulmonary effort is normal.     Breath sounds: Normal breath sounds.  Neurological:     Mental Status: He is alert.     BP 128/88 (BP Location: Right Arm, Cuff Size: Normal)   Pulse 78   Temp 98.1 F (36.7 C) (Oral)   Wt 178 lb 6.4 oz (80.9 kg)   SpO2 97%   BMI 26.73 kg/m  Wt Readings from Last 3 Encounters:  09/28/20 178 lb 6.4 oz (80.9 kg)  01/27/20 167 lb 3.2 oz (75.8 kg)  04/04/19 166 lb 4.8 oz (75.4 kg)     Health Maintenance Due  Topic Date Due  . COLONOSCOPY (Pts 45-64yrs Insurance coverage will need to be confirmed)  Never  done    There are no preventive care reminders to display for this patient.  Lab Results  Component Value Date   TSH 1.22 01/27/2020   Lab Results  Component Value Date   WBC 7.1 01/27/2020   HGB 15.7 01/27/2020   HCT 46.1 01/27/2020   MCV 92.0 01/27/2020   PLT 226 01/27/2020   Lab Results  Component Value Date   NA 138 01/27/2020   K 4.2 01/27/2020   CO2 25 01/27/2020   GLUCOSE 95 01/27/2020   BUN 12 01/27/2020   CREATININE 0.91 01/27/2020   BILITOT 0.5 01/27/2020   ALKPHOS 55 01/24/2019   AST 20 01/27/2020   ALT 19 01/27/2020   PROT 6.7 01/27/2020   ALBUMIN 4.4 01/24/2019   CALCIUM 9.8 01/27/2020   GFR 79.97 01/24/2019   Lab Results  Component Value Date   CHOL 196 01/27/2020   Lab Results  Component Value Date   HDL 49 01/27/2020   Lab Results  Component Value Date   LDLCALC 122 (H) 01/27/2020   Lab Results  Component Value Date   TRIG 137 01/27/2020   Lab Results  Component Value Date   CHOLHDL 4.0 01/27/2020   No results found for: HGBA1C    Assessment & Plan:   #1 bilateral cerumen impactions -Discussed risk and benefits of irrigation including risk of pain, bleeding, low risk of perforation.  Patient consented Both ear canals were irrigated by nurse and patient tolerated well.  Eardrums appear normal  #2 upper airway congestion with frequent clearing of throat.  We explained this could be related to postnasal drip and or possible silent GERD  -Consider trial of Flonase 1 to 2 sprays per nostril once daily and he will consider over-the-counter antihistamine. -If not getting relief with that consider trial of over-the-counter PPI    No orders of the defined types were placed in this encounter.   Follow-up: No follow-ups on file.    Evelena Peat, MD

## 2020-09-28 NOTE — Patient Instructions (Signed)

## 2021-01-17 ENCOUNTER — Other Ambulatory Visit: Payer: Self-pay

## 2021-01-18 ENCOUNTER — Ambulatory Visit (INDEPENDENT_AMBULATORY_CARE_PROVIDER_SITE_OTHER): Payer: Managed Care, Other (non HMO) | Admitting: Family Medicine

## 2021-01-18 ENCOUNTER — Encounter: Payer: Self-pay | Admitting: Family Medicine

## 2021-01-18 VITALS — BP 120/80 | HR 66 | Temp 97.5°F | Ht 69.29 in | Wt 168.5 lb

## 2021-01-18 DIAGNOSIS — Z0000 Encounter for general adult medical examination without abnormal findings: Secondary | ICD-10-CM | POA: Diagnosis not present

## 2021-01-18 LAB — HEPATIC FUNCTION PANEL
ALT: 18 U/L (ref 0–53)
AST: 19 U/L (ref 0–37)
Albumin: 4.4 g/dL (ref 3.5–5.2)
Alkaline Phosphatase: 54 U/L (ref 39–117)
Bilirubin, Direct: 0.1 mg/dL (ref 0.0–0.3)
Total Bilirubin: 0.6 mg/dL (ref 0.2–1.2)
Total Protein: 6.8 g/dL (ref 6.0–8.3)

## 2021-01-18 LAB — CBC WITH DIFFERENTIAL/PLATELET
Basophils Absolute: 0 10*3/uL (ref 0.0–0.1)
Basophils Relative: 0.4 % (ref 0.0–3.0)
Eosinophils Absolute: 0.1 10*3/uL (ref 0.0–0.7)
Eosinophils Relative: 0.8 % (ref 0.0–5.0)
HCT: 47.1 % (ref 39.0–52.0)
Hemoglobin: 16 g/dL (ref 13.0–17.0)
Lymphocytes Relative: 6.9 % — ABNORMAL LOW (ref 12.0–46.0)
Lymphs Abs: 0.6 10*3/uL — ABNORMAL LOW (ref 0.7–4.0)
MCHC: 33.9 g/dL (ref 30.0–36.0)
MCV: 93.6 fl (ref 78.0–100.0)
Monocytes Absolute: 0.8 10*3/uL (ref 0.1–1.0)
Monocytes Relative: 9.3 % (ref 3.0–12.0)
Neutro Abs: 7.3 10*3/uL (ref 1.4–7.7)
Neutrophils Relative %: 82.6 % — ABNORMAL HIGH (ref 43.0–77.0)
Platelets: 213 10*3/uL (ref 150.0–400.0)
RBC: 5.03 Mil/uL (ref 4.22–5.81)
RDW: 13.2 % (ref 11.5–15.5)
WBC: 8.9 10*3/uL (ref 4.0–10.5)

## 2021-01-18 LAB — LIPID PANEL
Cholesterol: 201 mg/dL — ABNORMAL HIGH (ref 0–200)
HDL: 48.2 mg/dL (ref >39.00)
LDL Cholesterol: 121 mg/dL — ABNORMAL HIGH (ref 0–99)
NonHDL: 152.32
Total CHOL/HDL Ratio: 4
Triglycerides: 156 mg/dL — ABNORMAL HIGH (ref 0.0–149.0)
VLDL: 31.2 mg/dL (ref 0.0–40.0)

## 2021-01-18 LAB — BASIC METABOLIC PANEL
BUN: 13 mg/dL (ref 6–23)
CO2: 31 mEq/L (ref 19–32)
Calcium: 9.7 mg/dL (ref 8.4–10.5)
Chloride: 100 mEq/L (ref 96–112)
Creatinine, Ser: 0.9 mg/dL (ref 0.40–1.50)
GFR: 98.46 mL/min (ref >60.00)
Glucose, Bld: 88 mg/dL (ref 70–99)
Potassium: 4.4 mEq/L (ref 3.5–5.1)
Sodium: 138 mEq/L (ref 135–145)

## 2021-01-18 LAB — PSA: PSA: 0.79 ng/mL (ref 0.10–4.00)

## 2021-01-18 LAB — TSH: TSH: 1.07 u[IU]/mL (ref 0.35–5.50)

## 2021-01-18 NOTE — Progress Notes (Signed)
Established Patient Office Visit  Subjective:  Patient ID: Warren Barrera, male    DOB: [birth year]  Age: 50 y.o. MRN: [id number]  CC:  Chief Complaint  Patient presents with   Annual Exam    HPI Warren Barrera presents for physical exam.  He is generally doing well.  2 year old son diagnosed earlier this week with COVID.  Thus far, other family members have not been ill.  They have tried to stay relatively isolated.  His son is recovering.  [name] has colonoscopy scheduled for November. He continues to walk some for exercise.  Health maintenance reviewed:  -Colonoscopy scheduled for November -Tetanus due 2028 -Declines shingles vaccine at this time but will consider later this year. -He has received COVID-vaccine with 1 booster.  Family history reviewed-Father with history of CAD.  Mother alive and well.  He has a brother with no significant health concerns.  Social history-he is married.  His wife is a Education officer, community and she is started in the past year working part-time.  She sold her practice.  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  24 year old son and 58 year old son.  Patient is non-smoker.  No regular alcohol use.  Walks usually about 3 miles per day for exercise.  Past Medical History:  Diagnosis Date   Acute sinusitis, unspecified 04/20/2009   Allergy     Past Surgical History:  Procedure Laterality Date   KNEE ARTHROSCOPY W/ MENISCAL REPAIR  1997    Family History  Problem Relation Age of Onset   Heart disease Father 26       angioplasty   Hyperlipidemia Father    Cancer Other        prostate/fhx   Hypertension Other        parent, grandparent   Heart attack Paternal Grandmother    Heart attack Paternal Grandfather     Social History   Socioeconomic History   Marital status: Married    Spouse name: Not on file   Number of children: Not on file   Years of education: Not on file   Highest education level: Not on file  Occupational History   Not on  file  Tobacco Use   Smoking status: Never   Smokeless tobacco: Never  Vaping Use   Vaping Use: Never used  Substance and Sexual Activity   Alcohol use: No   Drug use: No   Sexual activity: Not on file  Other Topics Concern   Not on file  Social History Narrative   Not on file   Social Determinants of Health   Financial Resource Strain: Not on file  Food Insecurity: Not on file  Transportation Needs: Not on file  Physical Activity: Not on file  Stress: Not on file  Social Connections: Not on file  Intimate Partner Violence: Not on file    Outpatient Medications Prior to Visit  Medication Sig Dispense Refill   Ascorbic Acid (VITAMIN C) 100 MG tablet Take 100 mg by mouth daily.     glucosamine-chondroitin 500-400 MG tablet Take 1 tablet by mouth daily.     Multiple Vitamin (MULTIVITAMIN) tablet Take 1 tablet by mouth daily.     Turmeric 500 MG CAPS Take 2 capsules by mouth daily.     trimethoprim-polymyxin b (POLYTRIM) ophthalmic solution Place 2 drops into the left eye every 4 (four) hours. 10 mL 0   No facility-administered medications prior to visit.    Allergies  Allergen Reactions   Oxycodone-Acetaminophen  REACTION: rash from Percocet    ROS Review of Systems  Constitutional:  Negative for activity change, appetite change, fatigue and fever.  HENT:  Negative for congestion, ear pain and trouble swallowing.   Eyes:  Negative for pain and visual disturbance.  Respiratory:  Negative for cough, shortness of breath and wheezing.   Cardiovascular:  Negative for chest pain and palpitations.  Gastrointestinal:  Negative for abdominal distention, abdominal pain, blood in stool, constipation, diarrhea, nausea, rectal pain and vomiting.  Endocrine: Negative for polydipsia and polyuria.  Genitourinary:  Negative for dysuria, hematuria and testicular pain.  Musculoskeletal:  Negative for arthralgias and joint swelling.  Skin:  Negative for rash.  Neurological:   Negative for dizziness, syncope and headaches.  Hematological:  Negative for adenopathy.  Psychiatric/Behavioral:  Negative for confusion and dysphoric mood.      Objective:    Physical Exam Constitutional:      General: He is not in acute distress.    Appearance: He is well-developed.  HENT:     Head: Normocephalic and atraumatic.     Right Ear: External ear normal.     Left Ear: External ear normal.  Eyes:     Conjunctiva/sclera: Conjunctivae normal.     Pupils: Pupils are equal, round, and reactive to light.  Neck:     Thyroid: No thyromegaly.  Cardiovascular:     Rate and Rhythm: Normal rate and regular rhythm.     Heart sounds: Normal heart sounds. No murmur heard. Pulmonary:     Effort: No respiratory distress.     Breath sounds: No wheezing or rales.  Abdominal:     General: Bowel sounds are normal. There is no distension.     Palpations: Abdomen is soft. There is no mass.     Tenderness: There is no abdominal tenderness. There is no guarding or rebound.  Musculoskeletal:     Cervical back: Normal range of motion and neck supple.     Right lower leg: No edema.     Left lower leg: No edema.  Lymphadenopathy:     Cervical: No cervical adenopathy.  Skin:    Findings: No rash.  Neurological:     Mental Status: He is alert and oriented to person, place, and time.     Cranial Nerves: No cranial nerve deficit.     Deep Tendon Reflexes: Reflexes normal.  Psychiatric:        Mood and Affect: Mood normal.    BP 120/80 (BP Location: Left Arm, Cuff Size: Normal)   Pulse 66   Temp (!) 97.5 F (36.4 C) (Oral)   Ht 5' 9.29" (1.76 m)   Wt 168 lb 8 oz (76.4 kg)   SpO2 98%   BMI 24.67 kg/m  Wt Readings from Last 3 Encounters:  01/18/21 168 lb 8 oz (76.4 kg)  09/28/20 178 lb 6.4 oz (80.9 kg)  01/27/20 167 lb 3.2 oz (75.8 kg)     Health Maintenance Due  Topic Date Due   COLONOSCOPY (Pts 45-19yrs Insurance coverage will need to be confirmed)  Never done   COVID-19  Vaccine (4 - Booster for Pfizer series) 09/02/2020    There are no preventive care reminders to display for this patient.  Lab Results  Component Value Date   TSH 1.22 01/27/2020   Lab Results  Component Value Date   WBC 7.1 01/27/2020   HGB 15.7 01/27/2020   HCT 46.1 01/27/2020   MCV 92.0 01/27/2020   PLT 226 01/27/2020  Lab Results  Component Value Date   NA 138 01/27/2020   K 4.2 01/27/2020   CO2 25 01/27/2020   GLUCOSE 95 01/27/2020   BUN 12 01/27/2020   CREATININE 0.91 01/27/2020   BILITOT 0.5 01/27/2020   ALKPHOS 55 01/24/2019   AST 20 01/27/2020   ALT 19 01/27/2020   PROT 6.7 01/27/2020   ALBUMIN 4.4 01/24/2019   CALCIUM 9.8 01/27/2020   GFR 79.97 01/24/2019   Lab Results  Component Value Date   CHOL 196 01/27/2020   Lab Results  Component Value Date   HDL 49 01/27/2020   Lab Results  Component Value Date   LDLCALC 122 (H) 01/27/2020   Lab Results  Component Value Date   TRIG 137 01/27/2020   Lab Results  Component Value Date   CHOLHDL 4.0 01/27/2020   No results found for: HGBA1C    Assessment & Plan:   Problem List Items Addressed This Visit   None Visit Diagnoses     Physical exam    -  Primary   Relevant Orders   Basic metabolic panel   Lipid panel   CBC with Differential/Platelet   TSH   Hepatic function panel   PSA     Generally healthy 50 year old male.  No active medical problems.  -Colonoscopy already scheduled for November -Continue annual flu vaccine -Discussed COVID booster vaccine -Discussed Shingrix vaccine he wishes to wait at this time but will consider later this year  No orders of the defined types were placed in this encounter.   Follow-up: No follow-ups on file.    Evelena Peat, MD

## 2021-01-21 ENCOUNTER — Telehealth: Payer: Self-pay | Admitting: Family Medicine

## 2021-01-21 NOTE — Telephone Encounter (Signed)
Patient called this morning to state that after his appointment with Dr. Caryl Never on Friday 7/29, he tested positive for COVID.

## 2021-01-21 NOTE — Telephone Encounter (Signed)
FYI, please advise

## 2021-01-21 NOTE — Telephone Encounter (Signed)
Informed patient of message.  Voiced understanding.   He stated that at this time his symptoms are sore throat, congestion, mild headache.

## 2022-01-21 ENCOUNTER — Encounter: Payer: Self-pay | Admitting: Family Medicine

## 2022-01-21 ENCOUNTER — Ambulatory Visit (INDEPENDENT_AMBULATORY_CARE_PROVIDER_SITE_OTHER): Payer: Managed Care, Other (non HMO) | Admitting: Family Medicine

## 2022-01-21 VITALS — BP 120/80 | HR 65 | Temp 97.6°F | Ht 69.29 in | Wt 168.9 lb

## 2022-01-21 DIAGNOSIS — Z0000 Encounter for general adult medical examination without abnormal findings: Secondary | ICD-10-CM

## 2022-01-21 LAB — BASIC METABOLIC PANEL
BUN: 15 mg/dL (ref 6–23)
CO2: 29 mEq/L (ref 19–32)
Calcium: 9.7 mg/dL (ref 8.4–10.5)
Chloride: 101 mEq/L (ref 96–112)
Creatinine, Ser: 0.9 mg/dL (ref 0.40–1.50)
GFR: 97.77 mL/min (ref >60.00)
Glucose, Bld: 93 mg/dL (ref 70–99)
Potassium: 4.3 mEq/L (ref 3.5–5.1)
Sodium: 137 mEq/L (ref 135–145)

## 2022-01-21 LAB — HEPATIC FUNCTION PANEL
ALT: 23 U/L (ref 0–53)
AST: 21 U/L (ref 0–37)
Albumin: 4.4 g/dL (ref 3.5–5.2)
Alkaline Phosphatase: 51 U/L (ref 39–117)
Bilirubin, Direct: 0.1 mg/dL (ref 0.0–0.3)
Total Bilirubin: 0.7 mg/dL (ref 0.2–1.2)
Total Protein: 7 g/dL (ref 6.0–8.3)

## 2022-01-21 LAB — LIPID PANEL
Cholesterol: 193 mg/dL (ref 0–200)
HDL: 48.7 mg/dL (ref >39.00)
LDL Cholesterol: 123 mg/dL — ABNORMAL HIGH (ref 0–99)
NonHDL: 144.16
Total CHOL/HDL Ratio: 4
Triglycerides: 105 mg/dL (ref 0.0–149.0)
VLDL: 21 mg/dL (ref 0.0–40.0)

## 2022-01-21 LAB — CBC WITH DIFFERENTIAL/PLATELET
Basophils Absolute: 0 10*3/uL (ref 0.0–0.1)
Basophils Relative: 0.9 % (ref 0.0–3.0)
Eosinophils Absolute: 0.2 10*3/uL (ref 0.0–0.7)
Eosinophils Relative: 3 % (ref 0.0–5.0)
HCT: 46.7 % (ref 39.0–52.0)
Hemoglobin: 15.8 g/dL (ref 13.0–17.0)
Lymphocytes Relative: 24.5 % (ref 12.0–46.0)
Lymphs Abs: 1.3 10*3/uL (ref 0.7–4.0)
MCHC: 33.7 g/dL (ref 30.0–36.0)
MCV: 92.4 fl (ref 78.0–100.0)
Monocytes Absolute: 0.7 10*3/uL (ref 0.1–1.0)
Monocytes Relative: 12.6 % — ABNORMAL HIGH (ref 3.0–12.0)
Neutro Abs: 3.1 10*3/uL (ref 1.4–7.7)
Neutrophils Relative %: 59 % (ref 43.0–77.0)
Platelets: 227 10*3/uL (ref 150.0–400.0)
RBC: 5.05 Mil/uL (ref 4.22–5.81)
RDW: 13 % (ref 11.5–15.5)
WBC: 5.2 10*3/uL (ref 4.0–10.5)

## 2022-01-21 LAB — PSA: PSA: 0.96 ng/mL (ref 0.10–4.00)

## 2022-01-21 LAB — TSH: TSH: 1.28 u[IU]/mL (ref 0.35–5.50)

## 2022-01-21 NOTE — Progress Notes (Signed)
Established Patient Office Visit  Subjective   Patient ID: Warren Barrera, male    DOB: 04/20/1971  Age: 51 y.o. MRN: [id number]  Chief Complaint  Patient presents with   Annual Exam    HPI   Warren Barrera is seen for annual physical exam.  Generally doing well.  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  Has 1 son getting ready to start his senior year.  Other is 53 years of age.  Has some ongoing knee issues with arthritis but able to walk for exercise.  Health maintenance reviewed  -Colonoscopy last November which was normal -Has had first Shingrix vaccine and plans to get second -Tetanus up-to-date -No history of hepatitis C screening but low risk  Family history reviewed-Father with history of CAD.  Mother alive and well.  He has a brother with no significant health concerns.   Social history-he is married.  His wife is a Education officer, community and she is started in the past year working part-time.  She sold her practice.  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  Patient is non-smoker.  No regular alcohol use.  Walks usually about 3 miles per day for exercise.  Past Medical History:  Diagnosis Date   Acute sinusitis, unspecified 04/20/2009   Allergy    Past Surgical History:  Procedure Laterality Date   KNEE ARTHROSCOPY W/ MENISCAL REPAIR  1997    reports that he has never smoked. He has never used smokeless tobacco. He reports that he does not drink alcohol and does not use drugs. family history includes Cancer in an other family member; Heart attack in his paternal grandfather and paternal grandmother; Heart disease (age of onset: 77) in his father; Hyperlipidemia in his father; Hypertension in an other family member. Allergies  Allergen Reactions   Oxycodone-Acetaminophen     REACTION: rash from Percocet     Review of Systems  Constitutional:  Negative for chills, fever,  malaise/fatigue and weight loss.  HENT:  Negative for hearing loss.   Eyes:  Negative for blurred vision and double vision.  Respiratory:  Negative for cough and shortness of breath.   Cardiovascular:  Negative for chest pain, palpitations and leg swelling.  Gastrointestinal:  Negative for abdominal pain, blood in stool, constipation and diarrhea.  Genitourinary:  Negative for dysuria.  Skin:  Negative for rash.  Neurological:  Negative for dizziness, speech change, seizures, loss of consciousness and headaches.  Psychiatric/Behavioral:  Negative for depression.       Objective:     BP 120/80 (BP Location: Left Arm, Patient Position: Sitting, Cuff Size: Normal)   Pulse 65   Temp 97.6 F (36.4 C) (Oral)   Ht 5' 9.29" (1.76 m)   Wt 168 lb 14.4 oz (76.6 kg)   SpO2 98%   BMI 24.73 kg/m    Physical Exam Constitutional:      General: He is not in acute distress.    Appearance: He is well-developed.  HENT:     Head: Normocephalic and atraumatic.     Right Ear: External ear normal.     Left Ear: External ear normal.  Eyes:     Conjunctiva/sclera: Conjunctivae normal.     Pupils: Pupils are equal, round, and reactive to light.  Neck:     Thyroid: No thyromegaly.  Cardiovascular:     Rate and Rhythm: Normal rate and regular rhythm.  Heart sounds: Normal heart sounds. No murmur heard. Pulmonary:     Effort: No respiratory distress.     Breath sounds: No wheezing or rales.  Abdominal:     General: Bowel sounds are normal. There is no distension.     Palpations: Abdomen is soft. There is no mass.     Tenderness: There is no abdominal tenderness. There is no guarding or rebound.  Musculoskeletal:     Cervical back: Normal range of motion and neck supple.     Right lower leg: No edema.     Left lower leg: No edema.  Lymphadenopathy:     Cervical: No cervical adenopathy.  Skin:    Findings: No rash.  Neurological:     Mental Status: He is alert and oriented to person,  place, and time.     Cranial Nerves: No cranial nerve deficit.      No results found for any visits on 01/21/22.    The 10-year ASCVD risk score (Arnett DK, et al., 2019) is: 4.3%    Assessment & Plan:   Problem List Items Addressed This Visit   None Visit Diagnoses     Physical exam    -  Primary   Relevant Orders   Basic metabolic panel   Lipid panel   CBC with Differential/Platelet   TSH   Hepatic function panel   PSA   Hep C Antibody     -Recommend continue annual flu vaccine -Check labs as above including hepatitis C antibody -Finish out Shingrix vaccine with second dose in a couple months -Recent colonoscopy normal -Continue regular exercise habits  No follow-ups on file.    Evelena Peat, MD

## 2022-01-22 LAB — HEPATITIS C ANTIBODY: Hepatitis C Ab: NONREACTIVE

## 2022-01-26 ENCOUNTER — Encounter (HOSPITAL_BASED_OUTPATIENT_CLINIC_OR_DEPARTMENT_OTHER): Payer: Self-pay | Admitting: Emergency Medicine

## 2022-01-26 ENCOUNTER — Emergency Department (HOSPITAL_BASED_OUTPATIENT_CLINIC_OR_DEPARTMENT_OTHER)
Admission: EM | Admit: 2022-01-26 | Discharge: 2022-01-26 | Disposition: A | Discharge status: Home / Self Care | Payer: Managed Care, Other (non HMO) | Attending: Emergency Medicine | Admitting: Emergency Medicine

## 2022-01-26 ENCOUNTER — Emergency Department (HOSPITAL_BASED_OUTPATIENT_CLINIC_OR_DEPARTMENT_OTHER): Payer: Managed Care, Other (non HMO) | Admitting: Radiology

## 2022-01-26 DIAGNOSIS — M79641 Pain in right hand: Secondary | ICD-10-CM | POA: Insufficient documentation

## 2022-01-26 DIAGNOSIS — S52571A Other intraarticular fracture of lower end of right radius, initial encounter for closed fracture: Secondary | ICD-10-CM | POA: Diagnosis not present

## 2022-01-26 DIAGNOSIS — S52601A Unspecified fracture of lower end of right ulna, initial encounter for closed fracture: Secondary | ICD-10-CM | POA: Diagnosis not present

## 2022-01-26 DIAGNOSIS — S6991XA Unspecified injury of right wrist, hand and finger(s), initial encounter: Secondary | ICD-10-CM | POA: Diagnosis present

## 2022-01-26 NOTE — ED Notes (Signed)
Patient states he does not want the sling.  States "mother in law was seen two weeks ago for a shoulder injury and didn't use her sling".  States he will use her sling.  Patient given instructions on use of sling.

## 2022-01-26 NOTE — ED Triage Notes (Signed)
Took 3 advil on the way here.

## 2022-01-26 NOTE — ED Notes (Signed)
Pt declines sling

## 2022-01-26 NOTE — ED Provider Notes (Signed)
[facility] EMERGENCY DEPT Provider Note   CSN: [id number] Arrival date & time: 01/26/22  1635     History  Chief Complaint  Patient presents with   Wrist Pain    Warren Barrera is a 51 y.o. male.  51 year old male presents with concern for right arm injury after a fall while rollerskating today.  Patient is right-hand dominant, reports pain in his right wrist, right arm and shoulder.  Patient took Advil prior to arrival with improvement in his pain.  Pain is worse with movement.  No prior injuries to this wrist.  No significant past medical history.       Home Medications Prior to Admission medications   Medication Sig Start Date End Date Taking? Authorizing Provider  Ascorbic Acid (VITAMIN C) 100 MG tablet Take 100 mg by mouth daily.    [provider]  azithromycin (ZITHROMAX) 250 MG tablet Take by mouth. 12/19/21   [provider]  glucosamine-chondroitin 500-400 MG tablet Take 1 tablet by mouth daily.    [provider]  Multiple Vitamin (MULTIVITAMIN) tablet Take 1 tablet by mouth daily.    [provider]  neomycin-polymyxin b-dexamethasone (MAXITROL) 3.5-10000-0.1 SUSP Place into the left eye. 12/19/21   [provider]  Turmeric 500 MG CAPS Take 2 capsules by mouth daily.    [provider]      Allergies    Oxycodone-acetaminophen    Review of Systems   Review of Systems Negative except as per HPI Physical Exam Updated Vital Signs BP (!) 145/110 (BP Location: Left Arm)   Pulse (!) 55   Temp 98.2 F (36.8 C) (Oral)   Resp 14   SpO2 98%  Physical Exam Vitals and nursing note reviewed.  Constitutional:      General: He is not in acute distress.    Appearance: He is well-developed. He is not diaphoretic.  HENT:     Head: Normocephalic and atraumatic.  Cardiovascular:     Pulses: Normal pulses.  Pulmonary:     Effort: Pulmonary effort is normal.  Musculoskeletal:        General: Swelling  and tenderness present.     Right shoulder: Normal.     Right elbow: Normal.     Right wrist: Swelling and tenderness present. Decreased range of motion. Normal pulse.     Cervical back: No tenderness or bony tenderness. No pain with movement.     Comments: Swelling with tenderness along the distal radius and ulna, radial pulse present, brisk capillary refill present to fingers with sensation intact.  Skin:    General: Skin is warm and dry.  Neurological:     Mental Status: He is alert and oriented to person, place, and time.     Sensory: No sensory deficit.  Psychiatric:        Behavior: Behavior normal.     ED Results / Procedures / Treatments   Labs (all labs ordered are listed, but only abnormal results are displayed) Labs Reviewed - No data to display  EKG None  Radiology DG Shoulder Right  Result Date: 01/26/2022 CLINICAL DATA:  Fall while skating, pain EXAM: RIGHT SHOULDER - 2+ VIEW COMPARISON:  None Available. FINDINGS: There is no evidence of fracture or dislocation. Mild acromioclavicular and glenohumeral arthrosis. Soft tissues are unremarkable. IMPRESSION: No fracture or dislocation of the right shoulder. Electronically Signed   By: Jearld Lesch M.D.   On: 01/26/2022 17:53   DG Wrist Complete Right  Result Date: 01/26/2022  CLINICAL DATA:  Fall while skating, pain EXAM: RIGHT WRIST - COMPLETE 3+ VIEW; RIGHT FOREARM - 2 VIEW COMPARISON:  None Available. FINDINGS: Comminuted intra-articular fractures of the distal right radius. The distal ulna is intact. Carpus proper is normally aligned. No fracture or dislocation of the proximal right radius or ulna. Soft tissue edema overlying the dorsal wrist. IMPRESSION: 1. Comminuted intra-articular fractures of the distal right radius. The distal ulna is intact. 2. No fracture or dislocation of the proximal right radius or ulna. 3. Soft tissue edema overlying the dorsal wrist. Electronically Signed   By: Jearld Lesch M.D.   On:  01/26/2022 17:52   DG Forearm Right  Result Date: 01/26/2022 CLINICAL DATA:  Fall while skating, pain EXAM: RIGHT WRIST - COMPLETE 3+ VIEW; RIGHT FOREARM - 2 VIEW COMPARISON:  None Available. FINDINGS: Comminuted intra-articular fractures of the distal right radius. The distal ulna is intact. Carpus proper is normally aligned. No fracture or dislocation of the proximal right radius or ulna. Soft tissue edema overlying the dorsal wrist. IMPRESSION: 1. Comminuted intra-articular fractures of the distal right radius. The distal ulna is intact. 2. No fracture or dislocation of the proximal right radius or ulna. 3. Soft tissue edema overlying the dorsal wrist. Electronically Signed   By: Jearld Lesch M.D.   On: 01/26/2022 17:52    Procedures Procedures    Medications Ordered in ED Medications - No data to display  ED Course/ Medical Decision Making/ A&P                           Medical Decision Making Amount and/or Complexity of Data Reviewed Radiology: ordered.   SPLINT APPLICATION Date/Time: 7:17 PM Authorized by: Jeannie Fend Consent: Verbal consent obtained. Risks and benefits: risks, benefits and alternatives were discussed Consent given by: patient Splint applied by: orthopedic technician Location details: Right arm Splint type: Sugar-tong Supplies used: OCL, Ace, stockinette Post-procedure: The splinted body part was neurovascularly unchanged following the procedure. Patient tolerance: Patient tolerated the procedure well with no immediate complications.  51 year old male presents with right wrist pain after fall onto outstretched dominant hand while rollerskating today.  Also has pain in forearm and shoulder.  On exam, has tenderness to distal radius and ulna with radial pulse present, skin intact, sensation and capillary refill present to digits.  X-ray of right wrist, forearm, shoulder as ordered interpreted by myself shows comminuted intra-articular fracture of the right  distal radius, no fracture or dislocation of the proximal radius or ulna.  Agree with radiologist interpretation.  Patient would like to follow-up with Dr. Orlan Leavens, patient's wife has made contact with Dr. Orlan Leavens who agrees to see him in the office.  Patient is placed in a sugar-tong splint, declines sling-has 1 at home he will use.  Declines pain medication, states that he will use Motrin and Tylenol at home.  Recommend ice and elevate.        Final Clinical Impression(s) / ED Diagnoses Final diagnoses:  Other closed intra-articular fracture of distal end of right radius, initial encounter  Closed fracture of distal end of right ulna, unspecified fracture morphology, initial encounter    Rx / DC Orders ED Discharge Orders     None         Jeannie Fend, PA-C 01/26/22 1917    Franne Forts, DO 01/27/22 1638

## 2022-01-26 NOTE — ED Triage Notes (Signed)
Pt fell while roller skating, his right shoulder/tricep and wrist hurting.

## 2022-01-26 NOTE — Discharge Instructions (Signed)
Follow-up with orthopedics as planned. You can elevate the arm and apply ice on top of the splint for 20 minutes at a time.  Keep splint clean and dry. Return to ER for any concerning symptoms.

## 2022-01-26 NOTE — ED Notes (Signed)
Pt agreeable with d/c plan as discussed by provider- this nurse has verbally reinforced d/c instructions and provided pt with written copy- pt acknowledges verbal understanding and denies any additional questions, concerns, needs- pt ambulatory independently at d/c; gait steady no distress; Splint dry and intact - cap refill <2 seconds

## 2022-01-28 ENCOUNTER — Ambulatory Visit
Admission: RE | Admit: 2022-01-28 | Discharge: 2022-01-28 | Disposition: A | Discharge status: Home / Self Care | Payer: Managed Care, Other (non HMO) | Source: Ambulatory Visit | Attending: Orthopedic Surgery | Admitting: Orthopedic Surgery

## 2022-01-28 ENCOUNTER — Other Ambulatory Visit: Payer: Self-pay | Admitting: Orthopedic Surgery

## 2022-01-28 DIAGNOSIS — S52501A Unspecified fracture of the lower end of right radius, initial encounter for closed fracture: Secondary | ICD-10-CM

## 2022-08-22 ENCOUNTER — Ambulatory Visit: Payer: Managed Care, Other (non HMO) | Admitting: Family Medicine

## 2022-08-22 VITALS — BP 126/86 | HR 70 | Temp 97.5°F | Ht 69.29 in | Wt 173.5 lb

## 2022-08-22 DIAGNOSIS — H6123 Impacted cerumen, bilateral: Secondary | ICD-10-CM | POA: Diagnosis not present

## 2022-08-22 NOTE — Progress Notes (Unsigned)
   Established Patient Office Visit  Subjective   Patient ID: Warren Barrera, male    DOB: 1971/03/11  Age: 52 y.o. MRN: [id number]  Chief Complaint  Patient presents with   Ear Fullness    HPI  {History (Optional):23778} Bilateral ear congestion and fullness right greater than left.  History of cerumen impactions in the past and feels very similar.  He has tried some over-the-counter drops at home and home irrigation without much improvement.  Some hearing change.  No dizziness.   ROS    Objective:     BP 126/86 (BP Location: Left Arm, Patient Position: Sitting, Cuff Size: Normal)   Pulse 70   Temp (!) 97.5 F (36.4 C) (Oral)   Ht 5' 9.29" (1.76 m)   Wt 173 lb 8 oz (78.7 kg)   SpO2 98%   BMI 25.41 kg/m  {Vitals History (Optional):23777}  Physical Exam   No results found for any visits on 08/22/22.  {Labs (Optional):23779}  The 10-year ASCVD risk score (Arnett DK, et al., 2019) is: 4.4%    Assessment & Plan:   Problem List Items Addressed This Visit   None   No follow-ups on file.    Carolann Littler, MD

## 2023-01-27 ENCOUNTER — Encounter: Payer: Self-pay | Admitting: Family Medicine

## 2023-01-27 ENCOUNTER — Ambulatory Visit (INDEPENDENT_AMBULATORY_CARE_PROVIDER_SITE_OTHER): Payer: Managed Care, Other (non HMO) | Admitting: Family Medicine

## 2023-01-27 ENCOUNTER — Encounter: Payer: Managed Care, Other (non HMO) | Admitting: Family Medicine

## 2023-01-27 VITALS — BP 122/80 | HR 70 | Temp 97.3°F | Ht 68.5 in | Wt 158.4 lb

## 2023-01-27 DIAGNOSIS — Z0000 Encounter for general adult medical examination without abnormal findings: Secondary | ICD-10-CM

## 2023-01-27 DIAGNOSIS — Z8249 Family history of ischemic heart disease and other diseases of the circulatory system: Secondary | ICD-10-CM

## 2023-01-27 NOTE — Progress Notes (Signed)
Established Patient Office Visit  Subjective   Patient ID: Warren Barrera, male    DOB: 02-09-1971  Age: 52 y.o. MRN: [id number]  Chief Complaint  Patient presents with   Annual Exam    HPI   Warren Barrera is seen for annual physical exam.  He has made some dietary changes and has been very active over the summer and lost about 10 pounds intentionally.  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  Generally feels well.  Walks regularly for exercise.  Has osteoarthritis of the knees but overall stable.  He takes over-the-counter turmeric and glucosamine which seems to help  Health maintenance reviewed:  -He has completed Shingrix though we do not have the dates -Prior hepatitis C screen negative -Tetanus due 2028 -Colonoscopy due 2032  Social history-he is married.  He has 2 children.  His oldest will start in the [employer] this year.  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  Wife is a Education officer, community.  No history of smoking.  No regular alcohol.  Family history reviewed-Father with history of CAD.  Mother alive and well.  He has a brother with no significant health concerns   Past Medical History:  Diagnosis Date   Acute sinusitis, unspecified 04/20/2009   Allergy    Past Surgical History:  Procedure Laterality Date   KNEE ARTHROSCOPY W/ MENISCAL REPAIR  1997    reports that he has never smoked. He has never used smokeless tobacco. He reports that he does not drink alcohol and does not use drugs. family history includes Cancer in an other family member; Heart attack in his paternal grandfather and paternal grandmother; Heart disease (age of onset: 62) in his father; Hyperlipidemia in his father; Hypertension in an other family member. Allergies  Allergen Reactions   Oxycodone-Acetaminophen     REACTION: rash from Percocet      Review of Systems  Constitutional:  Negative for chills, fever and malaise/fatigue.  HENT:  Negative for  hearing loss.   Eyes:  Negative for blurred vision and double vision.  Respiratory:  Negative for cough and shortness of breath.   Cardiovascular:  Negative for chest pain, palpitations and leg swelling.  Gastrointestinal:  Negative for abdominal pain, blood in stool, constipation and diarrhea.  Genitourinary:  Negative for dysuria.  Skin:  Negative for rash.  Neurological:  Negative for dizziness, speech change, seizures, loss of consciousness and headaches.  Psychiatric/Behavioral:  Negative for depression.       Objective:     BP 122/80 (BP Location: Left Arm, Patient Position: Sitting, Cuff Size: Normal)   Pulse 70   Temp (!) 97.3 F (36.3 C) (Oral)   Ht 5' 8.5" (1.74 m)   Wt 158 lb 6.4 oz (71.8 kg)   SpO2 99%   BMI 23.73 kg/m  BP Readings from Last 3 Encounters:  01/27/23 122/80  08/22/22 126/86  01/26/22 (!) 145/110   Wt Readings from Last 3 Encounters:  01/27/23 158 lb 6.4 oz (71.8 kg)  08/22/22 173 lb 8 oz (78.7 kg)  01/21/22 168 lb 14.4 oz (76.6 kg)      Physical Exam Vitals reviewed.  Constitutional:      General: He is not in acute distress.    Appearance: He is well-developed. He is not ill-appearing.  HENT:     Head: Normocephalic and atraumatic.     Ears:     Comments: Small amount of cerumen  right canal but nonobstructing.  Left canal clear Eyes:     Pupils: Pupils are equal, round, and reactive to light.  Neck:     Thyroid: No thyromegaly.  Cardiovascular:     Rate and Rhythm: Normal rate and regular rhythm.     Heart sounds: Normal heart sounds. No murmur heard. Pulmonary:     Effort: No respiratory distress.     Breath sounds: No wheezing or rales.  Abdominal:     General: Bowel sounds are normal. There is no distension.     Palpations: Abdomen is soft. There is no mass.     Tenderness: There is no abdominal tenderness. There is no guarding or rebound.  Musculoskeletal:     Cervical back: Normal range of motion and neck supple.     Right  lower leg: No edema.     Left lower leg: No edema.  Lymphadenopathy:     Cervical: No cervical adenopathy.  Skin:    Findings: No rash.  Neurological:     Mental Status: He is alert and oriented to person, place, and time.     Cranial Nerves: No cranial nerve deficit.      No results found for any visits on 01/27/23.    The 10-year ASCVD risk score (Arnett DK, et al., 2019) is: 4.6%    Assessment & Plan:   Problem List Items Addressed This Visit   None Visit Diagnoses     Physical exam    -  Primary   Relevant Orders   Basic metabolic panel   Lipid panel   CBC with Differential/Platelet   TSH   Hepatic function panel   PSA     Healthy 52 year old male.  Positive family history of premature CAD.  Patient had coronary calcium score of 0 back in 2015.  We did discuss possible repeat coronary calcium at some point this year.  -Obtain fasting labs as above -Continue regular exercise habits -Continue annual flu vaccine  No follow-ups on file.    Warren Peat, MD

## 2023-01-27 NOTE — Patient Instructions (Signed)
Let me know if you would like to get repeat coronary calcium score/scan.

## 2023-01-29 NOTE — Addendum Note (Signed)
Addended by: Kristian Covey on: 01/29/2023 01:49 PM   Modules accepted: Orders

## 2023-03-17 ENCOUNTER — Other Ambulatory Visit (HOSPITAL_BASED_OUTPATIENT_CLINIC_OR_DEPARTMENT_OTHER): Payer: Managed Care, Other (non HMO)

## 2023-04-14 ENCOUNTER — Ambulatory Visit (HOSPITAL_BASED_OUTPATIENT_CLINIC_OR_DEPARTMENT_OTHER)
Admission: RE | Admit: 2023-04-14 | Discharge: 2023-04-14 | Disposition: A | Discharge status: Home / Self Care | Payer: Managed Care, Other (non HMO) | Source: Ambulatory Visit | Attending: Family Medicine | Admitting: Family Medicine

## 2023-04-14 DIAGNOSIS — Z8249 Family history of ischemic heart disease and other diseases of the circulatory system: Secondary | ICD-10-CM

## 2023-04-15 ENCOUNTER — Telehealth: Payer: Self-pay | Admitting: Family Medicine

## 2023-04-15 ENCOUNTER — Other Ambulatory Visit: Payer: Self-pay | Admitting: Family Medicine

## 2023-04-15 DIAGNOSIS — R931 Abnormal findings on diagnostic imaging of heart and coronary circulation: Secondary | ICD-10-CM

## 2023-04-15 MED ORDER — ROSUVASTATIN CALCIUM 10 MG PO TABS: 10.0000 mg | ORAL_TABLET | Freq: Every day | ORAL | 0 refills | Status: DC

## 2023-04-15 NOTE — Telephone Encounter (Signed)
Pt returned call for imaging results

## 2023-04-15 NOTE — Telephone Encounter (Signed)
Please see result note 

## 2023-07-09 ENCOUNTER — Other Ambulatory Visit: Payer: Self-pay | Admitting: Family Medicine

## 2023-12-28 ENCOUNTER — Other Ambulatory Visit: Payer: Self-pay | Admitting: Family Medicine

## 2024-02-01 ENCOUNTER — Ambulatory Visit: Payer: Self-pay | Admitting: Family Medicine

## 2024-02-01 ENCOUNTER — Telehealth: Payer: Self-pay

## 2024-02-01 ENCOUNTER — Ambulatory Visit (INDEPENDENT_AMBULATORY_CARE_PROVIDER_SITE_OTHER): Admitting: Family Medicine

## 2024-02-01 ENCOUNTER — Encounter: Payer: Self-pay | Admitting: Family Medicine

## 2024-02-01 VITALS — BP 114/84 | HR 66 | Temp 97.6°F | Ht 69.0 in | Wt 144.7 lb

## 2024-02-01 DIAGNOSIS — Z0000 Encounter for general adult medical examination without abnormal findings: Secondary | ICD-10-CM

## 2024-02-01 LAB — CBC WITH DIFFERENTIAL/PLATELET
Basophils Absolute: 0 K/uL (ref 0.0–0.1)
Basophils Relative: 0.8 % (ref 0.0–3.0)
Eosinophils Absolute: 0.1 K/uL (ref 0.0–0.7)
Eosinophils Relative: 2.7 % (ref 0.0–5.0)
HCT: 45.7 % (ref 39.0–52.0)
Hemoglobin: 15.4 g/dL (ref 13.0–17.0)
Lymphocytes Relative: 26.8 % (ref 12.0–46.0)
Lymphs Abs: 1.1 K/uL (ref 0.7–4.0)
MCHC: 33.7 g/dL (ref 30.0–36.0)
MCV: 93.2 fl (ref 78.0–100.0)
Monocytes Absolute: 0.5 K/uL (ref 0.1–1.0)
Monocytes Relative: 12.9 % — ABNORMAL HIGH (ref 3.0–12.0)
Neutro Abs: 2.3 K/uL (ref 1.4–7.7)
Neutrophils Relative %: 56.8 % (ref 43.0–77.0)
Platelets: 207 K/uL (ref 150.0–400.0)
RBC: 4.91 Mil/uL (ref 4.22–5.81)
RDW: 13 % (ref 11.5–15.5)
WBC: 4.1 K/uL (ref 4.0–10.5)

## 2024-02-01 LAB — HEPATIC FUNCTION PANEL
ALT: 24 U/L (ref 0–53)
AST: 21 U/L (ref 0–37)
Albumin: 4.4 g/dL (ref 3.5–5.2)
Alkaline Phosphatase: 49 U/L (ref 39–117)
Bilirubin, Direct: 0.1 mg/dL (ref 0.0–0.3)
Total Bilirubin: 0.5 mg/dL (ref 0.2–1.2)
Total Protein: 6.6 g/dL (ref 6.0–8.3)

## 2024-02-01 LAB — BASIC METABOLIC PANEL WITH GFR
BUN: 11 mg/dL (ref 6–23)
CO2: 29 meq/L (ref 19–32)
Calcium: 9.4 mg/dL (ref 8.4–10.5)
Chloride: 100 meq/L (ref 96–112)
Creatinine, Ser: 0.76 mg/dL (ref 0.40–1.50)
GFR: 101.44 mL/min (ref >60.00)
Glucose, Bld: 87 mg/dL (ref 70–99)
Potassium: 3.8 meq/L (ref 3.5–5.1)
Sodium: 136 meq/L (ref 135–145)

## 2024-02-01 LAB — LIPID PANEL
Cholesterol: 155 mg/dL (ref 0–200)
HDL: 55.8 mg/dL (ref >39.00)
LDL Cholesterol: 89 mg/dL (ref 0–99)
NonHDL: 99.6
Total CHOL/HDL Ratio: 3
Triglycerides: 51 mg/dL (ref 0.0–149.0)
VLDL: 10.2 mg/dL (ref 0.0–40.0)

## 2024-02-01 LAB — PSA: PSA: 0.64 ng/mL (ref 0.10–4.00)

## 2024-02-01 NOTE — Progress Notes (Signed)
 Established Patient Office Visit  Subjective   Patient ID: Warren Barrera, male    DOB: [birth year]  Age: 53 y.o. MRN: [id number]  Chief Complaint  Patient presents with   Annual Exam    HPI   Warren Barrera is seen for physical.  Very health-conscious.  He has history of hyperlipidemia was started last year on rosuvastatin  10 mg daily and tolerating well.  He had coronary calcium  score last year of 92.  Father had coronary disease at early age.  He walks for exercise.  He has had some intentional weight loss this past year.  Also recently was placed on Lamisil per dermatologist for toenail fungus and had some loss of taste which he thinks is attributing some to the weight loss.  Health maintenance reviewed:  Health Maintenance  Topic Date Due   Hepatitis B Vaccines (1 of 3 - 19+ 3-dose series) Never done   Pneumococcal Vaccine: 50+ Years (1 of 1 - PCV) Never done   Zoster Vaccines- Shingrix (1 of 2) Never done   COVID-19 Vaccine (5 - 2024-25 season) 02/22/2023   INFLUENZA VACCINE  01/22/2024   HIV Screening  01/22/2047 (Originally 12/06/1983)   DTaP/Tdap/Td (3 - Td or Tdap) 01/22/2027   Colonoscopy  05/14/2031   Hepatitis C Screening  Completed   HPV VACCINES  Aged Out   Meningococcal B Vaccine  Aged Out   - Has had Shingrix vaccine but we do not have dates -Undecided regarding Prevnar 20 but will consider  Family history-father has history of CAD and atrial fibrillation.  Mother has history of hyperlipidemia.  Brother alive and well.  Social history- he is married.  He has 2 children.  His oldest is in the [employer].   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  Wife is a Education officer, community.  No history of smoking.  No regular alcohol.  Past Medical History:  Diagnosis Date   Acute sinusitis, unspecified 04/20/2009   Allergy    Past Surgical History:  Procedure Laterality Date   KNEE ARTHROSCOPY W/ MENISCAL REPAIR  1997    reports that he has never smoked. He has  never used smokeless tobacco. He reports that he does not drink alcohol and does not use drugs. family history includes Cancer in an other family member; Heart attack in his paternal grandfather and paternal grandmother; Heart disease (age of onset: 37) in his father; Hyperlipidemia in his father; Hypertension in an other family member. Allergies  Allergen Reactions   Oxycodone-Acetaminophen     REACTION: rash from Percocet    Review of Systems  Constitutional:  Negative for chills, fever, malaise/fatigue and weight loss.  HENT:  Negative for hearing loss.   Eyes:  Negative for blurred vision and double vision.  Respiratory:  Negative for cough and shortness of breath.   Cardiovascular:  Negative for chest pain, palpitations and leg swelling.  Gastrointestinal:  Negative for abdominal pain, blood in stool, constipation and diarrhea.  Genitourinary:  Negative for dysuria.  Skin:  Negative for rash.  Neurological:  Negative for dizziness, speech change, seizures, loss of consciousness and headaches.  Psychiatric/Behavioral:  Negative for depression.        Objective:     BP 114/64   Pulse 66   Temp 97.6 F (36.4 C) (Oral)   Ht 5' 9 (1.753 m)   Wt 144 lb 11.2 oz (65.6 kg)   SpO2 98%   BMI 21.37 kg/m  BP Readings from Last  3 Encounters:  02/01/24 114/64  01/27/23 122/80  08/22/22 126/86   Wt Readings from Last 3 Encounters:  02/01/24 144 lb 11.2 oz (65.6 kg)  01/27/23 158 lb 6.4 oz (71.8 kg)  08/22/22 173 lb 8 oz (78.7 kg)      Physical Exam Vitals reviewed.  Constitutional:      General: He is not in acute distress.    Appearance: He is well-developed.  HENT:     Head: Normocephalic and atraumatic.     Right Ear: External ear normal.     Left Ear: External ear normal.  Eyes:     Conjunctiva/sclera: Conjunctivae normal.     Pupils: Pupils are equal, round, and reactive to light.  Neck:     Thyroid : No thyromegaly.  Cardiovascular:     Rate and Rhythm: Normal  rate and regular rhythm.     Heart sounds: Normal heart sounds. No murmur heard. Pulmonary:     Effort: No respiratory distress.     Breath sounds: No wheezing or rales.  Abdominal:     General: Bowel sounds are normal. There is no distension.     Palpations: Abdomen is soft. There is no mass.     Tenderness: There is no abdominal tenderness. There is no guarding or rebound.  Musculoskeletal:     Cervical back: Normal range of motion and neck supple.     Right lower leg: No edema.     Left lower leg: No edema.  Lymphadenopathy:     Cervical: No cervical adenopathy.  Skin:    Findings: No rash.  Neurological:     Mental Status: He is alert and oriented to person, place, and time.     Cranial Nerves: No cranial nerve deficit.      No results found for any visits on 02/01/24.    The 10-year ASCVD risk score (Arnett DK, et al., 2019) is: 4.5%    Assessment & Plan:   Problem List Items Addressed This Visit   None Visit Diagnoses       Physical exam    -  Primary   Relevant Orders   Basic metabolic panel with GFR   Lipid panel   CBC with Differential/Platelet   Hepatic function panel   PSA     - Discussed Prevnar 20 and he will consider - Shingrix already given.  We do not have dates - Continue annual flu vaccine and he gets this through school - Continue regular exercise habits with minimum of 150 minutes moderate intensity aerobic exercise per week  No follow-ups on file.    Wolm Scarlet, MD

## 2024-02-01 NOTE — Telephone Encounter (Signed)
 Copied from CRM #[id number]. Topic: General - Other >> Feb 01, 2024  2:46 PM Aisha D wrote: Reason for CRM: Pt is requesting to speak with someone regarding his BP vitals for today. Pt stated that when he was in the office he thought he heard Dr.Burchette's assistance say his BP was 114/84 but in the after visit summary it states his BP is 114/64. Pt would just like to confirm if he misheard the information.

## 2024-02-01 NOTE — Telephone Encounter (Signed)
 I spoke with the patient and informed him that his BP was 114/84 during OV today. Patient chart has been updated to reflect correct BP measurement

## 2024-02-02 ENCOUNTER — Telehealth: Payer: Self-pay

## 2024-02-02 NOTE — Telephone Encounter (Signed)
 I spoke with the patient and message sent to PCP. Please see result note

## 2024-02-02 NOTE — Telephone Encounter (Signed)
 Copied from CRM [id number]. Topic: Clinical - Lab/Test Results >> Feb 02, 2024 10:37 AM [place] J wrote: Reason for CRM: Patient called for lab results. I relayed the message from Dr. Micheal but the patient was still confused and needs more clarification. He also noticed that his monocytes were a bit high and has been trending higher over the past few years. He would like to know if that is something to be concerned about.  *Patient is okay with being reached out via MyChart if phone call is not successful.*

## 2024-04-08 ENCOUNTER — Other Ambulatory Visit: Payer: Self-pay | Admitting: Family Medicine

## 2024-06-20 ENCOUNTER — Ambulatory Visit: Admitting: Family Medicine

## 2024-06-21 ENCOUNTER — Ambulatory Visit: Admitting: Family Medicine

## 2024-06-21 ENCOUNTER — Encounter: Payer: Self-pay | Admitting: Family Medicine

## 2024-06-21 VITALS — BP 112/76 | HR 72 | Temp 97.7°F | Wt 152.3 lb

## 2024-06-21 DIAGNOSIS — H6123 Impacted cerumen, bilateral: Secondary | ICD-10-CM

## 2024-06-21 NOTE — Progress Notes (Signed)
" ° °  Established Patient Office Visit  Subjective   Patient ID: Warren Barrera, male    DOB: July 10, 1970  Age: 53 y.o. MRN: [id number]  Chief Complaint  Patient presents with   Cough   Ear Fullness    HPI   Warren Barrera is seen today with bilateral ear fullness for the past month.  He had some nasal congestion for about a week but really minimal cough.  No dizziness.  He has had history of cerumen impactions in the past and suspects the same.  No significant ear pain.  No drainage.  Has tried some wax softener drops  Past Medical History:  Diagnosis Date   Acute sinusitis, unspecified 04/20/2009   Allergy    Past Surgical History:  Procedure Laterality Date   KNEE ARTHROSCOPY W/ MENISCAL REPAIR  1997    reports that he has never smoked. He has never used smokeless tobacco. He reports that he does not drink alcohol and does not use drugs. family history includes Cancer in an other family member; Heart attack in his paternal grandfather and paternal grandmother; Heart disease (age of onset: 47) in his father; Hyperlipidemia in his father; Hypertension in an other family member. Allergies[1]  Review of Systems  Constitutional:  Negative for chills and fever.  HENT:  Positive for congestion and hearing loss. Negative for ear discharge and ear pain.       Objective:     BP 112/76   Pulse 72   Temp 97.7 F (36.5 C) (Oral)   Wt 152 lb 4.8 oz (69.1 kg)   SpO2 96%   BMI 22.49 kg/m  BP Readings from Last 3 Encounters:  06/21/24 112/76  02/01/24 114/84  01/27/23 122/80   Wt Readings from Last 3 Encounters:  06/21/24 152 lb 4.8 oz (69.1 kg)  02/01/24 144 lb 11.2 oz (65.6 kg)  01/27/23 158 lb 6.4 oz (71.8 kg)      Physical Exam Vitals reviewed.  Constitutional:      General: He is not in acute distress.    Appearance: He is not ill-appearing.  HENT:     Ears:     Comments: Patient had bilateral cerumen impactions on initial exam.  After irrigation both canals were clear other  than some very minimal residual cerumen.  Eardrums appeared normal.  Normal landmarks.  No obvious serous effusion. Neurological:     Mental Status: He is alert.      No results found for any visits on 06/21/24.    The 10-year ASCVD risk score (Arnett DK, et al., 2019) is: 3%    Assessment & Plan:   Bilateral cerumen impactions.  Patient tried wax softener without much improvement.  He is aware of risk of irrigation including low risk of bleeding and eardrum perforation.  Both ear canals were irrigated by nurse with successful removal of cerumen.  Patient could hear better afterwards.  No evidence for serous effusion.  Follow-up as needed  Wolm Scarlet, MD     [1]  Allergies Allergen Reactions   Oxycodone-Acetaminophen     REACTION: rash from Percocet   "
